# Patient Record
Sex: Female | Born: 1945 | Race: Black or African American | Hispanic: No | Marital: Single | Smoking: Never smoker
Health system: Southern US, Community
[De-identification: ages and names within clinical notes are randomized; demographics above are authoritative.]

## PROBLEM LIST (undated history)

## (undated) DIAGNOSIS — F419 Anxiety disorder, unspecified: Secondary | ICD-10-CM

## (undated) DIAGNOSIS — I10 Essential (primary) hypertension: Secondary | ICD-10-CM

## (undated) DIAGNOSIS — M8580 Other specified disorders of bone density and structure, unspecified site: Secondary | ICD-10-CM

## (undated) DIAGNOSIS — E785 Hyperlipidemia, unspecified: Secondary | ICD-10-CM

## (undated) DIAGNOSIS — K649 Unspecified hemorrhoids: Secondary | ICD-10-CM

## (undated) HISTORY — DX: Hyperlipidemia, unspecified: E78.5

## (undated) HISTORY — DX: Unspecified hemorrhoids: K64.9

## (undated) HISTORY — DX: Essential (primary) hypertension: I10

## (undated) HISTORY — DX: Other specified disorders of bone density and structure, unspecified site: M85.80

## (undated) HISTORY — DX: Anxiety disorder, unspecified: F41.9

## (undated) HISTORY — PX: NO PAST SURGERIES: SHX2092

---

## 1999-08-18 ENCOUNTER — Emergency Department (HOSPITAL_COMMUNITY): Admission: EM | Admit: 1999-08-18 | Discharge: 1999-08-18 | Payer: Self-pay | Admitting: Emergency Medicine

## 1999-10-20 ENCOUNTER — Encounter: Payer: Self-pay | Admitting: Family Medicine

## 1999-10-20 ENCOUNTER — Encounter: Admission: RE | Admit: 1999-10-20 | Discharge: 1999-10-20 | Payer: Self-pay | Admitting: Family Medicine

## 1999-10-27 ENCOUNTER — Inpatient Hospital Stay (HOSPITAL_COMMUNITY): Admission: RE | Admit: 1999-10-27 | Discharge: 1999-10-29 | Payer: Self-pay | Admitting: *Deleted

## 2000-10-20 ENCOUNTER — Encounter: Admission: RE | Admit: 2000-10-20 | Discharge: 2000-10-20 | Payer: Self-pay | Admitting: Family Medicine

## 2000-10-20 ENCOUNTER — Encounter: Payer: Self-pay | Admitting: Family Medicine

## 2001-11-03 ENCOUNTER — Encounter: Payer: Self-pay | Admitting: Family Medicine

## 2001-11-03 ENCOUNTER — Encounter: Admission: RE | Admit: 2001-11-03 | Discharge: 2001-11-03 | Payer: Self-pay | Admitting: Family Medicine

## 2002-11-27 ENCOUNTER — Encounter: Admission: RE | Admit: 2002-11-27 | Discharge: 2002-11-27 | Payer: Self-pay | Admitting: Family Medicine

## 2002-11-27 ENCOUNTER — Encounter: Payer: Self-pay | Admitting: Family Medicine

## 2003-06-08 ENCOUNTER — Encounter: Payer: Self-pay | Admitting: Family Medicine

## 2003-06-08 ENCOUNTER — Encounter: Admission: RE | Admit: 2003-06-08 | Discharge: 2003-06-08 | Payer: Self-pay | Admitting: Family Medicine

## 2003-06-23 ENCOUNTER — Encounter: Payer: Self-pay | Admitting: Family Medicine

## 2003-06-23 ENCOUNTER — Encounter: Admission: RE | Admit: 2003-06-23 | Discharge: 2003-06-23 | Payer: Self-pay | Admitting: Family Medicine

## 2004-12-05 ENCOUNTER — Encounter: Admission: RE | Admit: 2004-12-05 | Discharge: 2004-12-05 | Payer: Self-pay | Admitting: Family Medicine

## 2005-06-26 ENCOUNTER — Other Ambulatory Visit: Admission: RE | Admit: 2005-06-26 | Discharge: 2005-06-26 | Payer: Self-pay | Admitting: Family Medicine

## 2005-08-14 ENCOUNTER — Encounter: Admission: RE | Admit: 2005-08-14 | Discharge: 2005-08-14 | Payer: Self-pay | Admitting: Family Medicine

## 2005-10-09 ENCOUNTER — Ambulatory Visit (HOSPITAL_COMMUNITY): Admission: RE | Admit: 2005-10-09 | Discharge: 2005-10-09 | Payer: Self-pay | Admitting: *Deleted

## 2006-09-17 ENCOUNTER — Encounter: Admission: RE | Admit: 2006-09-17 | Discharge: 2006-09-17 | Payer: Self-pay | Admitting: Family Medicine

## 2006-10-05 ENCOUNTER — Encounter: Admission: RE | Admit: 2006-10-05 | Discharge: 2006-10-05 | Payer: Self-pay | Admitting: Family Medicine

## 2007-10-28 ENCOUNTER — Encounter: Admission: RE | Admit: 2007-10-28 | Discharge: 2007-10-28 | Payer: Self-pay | Admitting: Gastroenterology

## 2009-09-11 ENCOUNTER — Other Ambulatory Visit: Admission: RE | Admit: 2009-09-11 | Discharge: 2009-09-11 | Payer: Self-pay | Admitting: Family Medicine

## 2009-10-23 ENCOUNTER — Encounter: Admission: RE | Admit: 2009-10-23 | Discharge: 2009-10-23 | Payer: Self-pay | Admitting: Family Medicine

## 2010-09-24 ENCOUNTER — Other Ambulatory Visit: Admission: RE | Admit: 2010-09-24 | Discharge: 2010-09-24 | Payer: Self-pay | Admitting: Family Medicine

## 2010-12-23 ENCOUNTER — Encounter
Admission: RE | Admit: 2010-12-23 | Discharge: 2010-12-23 | Payer: Self-pay | Source: Home / Self Care | Attending: Family Medicine | Admitting: Family Medicine

## 2011-05-08 NOTE — Procedures (Signed)
. [facility]  Patient:    Monique Benjamin                     MRN: [id number] Proc. Date: 10/27/99 Adm. Date:  [id number] Attending:  Mingo Amber CC:         Gretta Arab Valentina Lucks, M.D.             Abigail Miyamoto, M.D.                           Procedure Report  PROCEDURE:  Video upper endoscopy and video colonoscopy.  INDICATIONS:  A 65 year old female who presented to the office late last week complaining of feeling faint and having vomited, however, she has been noticing  blood per rectum for a few weeks.  She feels decreased energy.  Hemoglobin in the office was 7.9, and her father was operated on for colon cancer in his 36s.  PREPARATION:  She is n.p.o. since midnight having taken Phospho-Soda prep and a  clear liquid diet.  The mucosa is clean throughout.  PREPROCEDURE SEDATION:  Prior to the upper endoscopy, she received 60 mg of Demerol and 6 mg of Versed.  In addition, her throat was anesthetized with cetacaine spray, and she was on 2 L of nasal cannula O2.  The preprocedure hemoglobin was 6.7.  DESCRIPTION OF PROCEDURE: VIDEO UPPER ENDOSCOPY:  The Olympus video upper endoscope was inserted via the mouth and advanced through the upper esophageal sphincter with ease. Intubation was then carried out into the descending duodenum.  On withdrawal, the mucosa was carefully evaluated and found to be entirely normal from the descending duodenum to the distal esophagus.  There were no signs of inflammation or bleeding or any other abnormality.  At the conclusion of this procedure, the patients position was reversed for video colonoscopy.  The patient also received an additional 40 mg of Demerol and 4 mg of Versed and remained on 2 L of nasal cannula O2.  VIDEO COLONOSCOPY:  The Olympus video colonoscope was inserted via the rectum.  Note was made of a very enlarged, bleeding external hemorrhoids.  The colonoscope was advanced  through a very tortuous colon.  Extra abdominal pressure and rotation onto her back were required in order to obtain the cecum.  However, cecal landmarks were identified and photographed, and on withdrawal, the mucosa was carefully evaluated and found to be entirely normal from cecum to rectum.  There were no inflammatory or neoplastic lesions, and all bleeding seemed to be coming from these very large, external hemorrhoids.  The patient tolerated both procedures well.  Pulse, blood pressure, and oximetry testing were stable throughout.  She was observed in recovery for 45 minutes and arrangements were made for a surgical consultation for consideration of admission for hemorrhoidectomy.  The patient ill be transfused, if she consents, as soon as possible. DD:  10/27/99 TD:  10/28/99 Job: [id number]

## 2012-11-16 ENCOUNTER — Other Ambulatory Visit: Payer: Self-pay | Admitting: Family Medicine

## 2012-11-16 DIAGNOSIS — Z1231 Encounter for screening mammogram for malignant neoplasm of breast: Secondary | ICD-10-CM

## 2012-12-12 ENCOUNTER — Other Ambulatory Visit: Payer: Self-pay | Admitting: Diagnostic Neuroimaging

## 2012-12-12 DIAGNOSIS — R279 Unspecified lack of coordination: Secondary | ICD-10-CM

## 2012-12-12 DIAGNOSIS — M542 Cervicalgia: Secondary | ICD-10-CM

## 2012-12-23 ENCOUNTER — Ambulatory Visit
Admission: RE | Admit: 2012-12-23 | Discharge: 2012-12-23 | Disposition: A | Discharge status: Home / Self Care | Payer: Medicare Other | Source: Ambulatory Visit | Attending: Diagnostic Neuroimaging | Admitting: Diagnostic Neuroimaging

## 2012-12-23 DIAGNOSIS — M542 Cervicalgia: Secondary | ICD-10-CM

## 2012-12-23 DIAGNOSIS — R279 Unspecified lack of coordination: Secondary | ICD-10-CM

## 2012-12-29 ENCOUNTER — Ambulatory Visit
Admission: RE | Admit: 2012-12-29 | Discharge: 2012-12-29 | Disposition: A | Discharge status: Home / Self Care | Payer: Medicare Other | Source: Ambulatory Visit | Attending: Family Medicine | Admitting: Family Medicine

## 2012-12-29 DIAGNOSIS — Z1231 Encounter for screening mammogram for malignant neoplasm of breast: Secondary | ICD-10-CM

## 2013-05-04 ENCOUNTER — Telehealth: Payer: Self-pay | Admitting: Diagnostic Neuroimaging

## 2013-05-08 NOTE — Telephone Encounter (Signed)
I called and LMVM for pt to return call.   

## 2013-05-16 ENCOUNTER — Ambulatory Visit: Payer: Self-pay | Admitting: Diagnostic Neuroimaging

## 2013-05-17 NOTE — Telephone Encounter (Signed)
I called pt. No answer. Left message with MRI results on phone:  Equivocal MRI brain (without contrast) demonstrating few non-specific foci of gliosis.  Abnormal MRI cervical spine (without contrast) demonstrating: 1. At C3-4: Disc bulging with moderate left foraminal stenosis. 2. At C5-6: Disc bulging with mild right foraminal stenosis.  PLAN: - Major problems ruled out. No definite explanation of her symptoms. Would observe and manage conservatively.  -VRP

## 2013-09-11 ENCOUNTER — Other Ambulatory Visit: Payer: Self-pay | Admitting: Otolaryngology

## 2013-09-11 DIAGNOSIS — R05 Cough: Secondary | ICD-10-CM

## 2013-09-11 DIAGNOSIS — K219 Gastro-esophageal reflux disease without esophagitis: Secondary | ICD-10-CM

## 2013-09-15 ENCOUNTER — Ambulatory Visit
Admission: RE | Admit: 2013-09-15 | Discharge: 2013-09-15 | Disposition: A | Discharge status: Home / Self Care | Payer: Medicare Other | Source: Ambulatory Visit | Attending: Otolaryngology | Admitting: Otolaryngology

## 2013-09-15 DIAGNOSIS — R05 Cough: Secondary | ICD-10-CM

## 2013-09-15 DIAGNOSIS — K219 Gastro-esophageal reflux disease without esophagitis: Secondary | ICD-10-CM

## 2014-01-23 ENCOUNTER — Other Ambulatory Visit: Payer: Self-pay

## 2014-01-23 DIAGNOSIS — Z1231 Encounter for screening mammogram for malignant neoplasm of breast: Secondary | ICD-10-CM

## 2014-02-08 ENCOUNTER — Ambulatory Visit
Admission: RE | Admit: 2014-02-08 | Discharge: 2014-02-08 | Disposition: A | Discharge status: Home / Self Care | Payer: Medicare Other | Source: Ambulatory Visit

## 2014-02-08 DIAGNOSIS — Z1231 Encounter for screening mammogram for malignant neoplasm of breast: Secondary | ICD-10-CM

## 2014-08-16 ENCOUNTER — Ambulatory Visit (INDEPENDENT_AMBULATORY_CARE_PROVIDER_SITE_OTHER): Payer: Medicare Other | Admitting: Diagnostic Neuroimaging

## 2014-08-16 ENCOUNTER — Encounter: Payer: Self-pay | Admitting: Diagnostic Neuroimaging

## 2014-08-16 VITALS — BP 135/69 | HR 63 | Ht 64.5 in | Wt 141.0 lb

## 2014-08-16 DIAGNOSIS — R279 Unspecified lack of coordination: Secondary | ICD-10-CM

## 2014-08-16 DIAGNOSIS — M542 Cervicalgia: Secondary | ICD-10-CM

## 2014-08-16 NOTE — Patient Instructions (Signed)
Try a new hobby/activity to improve coordination.   Start gradual exercise program (check with [employer], club, gym or local community center).

## 2014-08-16 NOTE — Progress Notes (Signed)
[facility]  PATIENT: Monique Benjamin DOB: [birth year]  REFERRING CLINICIAN: Valentina Lucks HISTORY FROM: patient  REASON FOR VISIT: follow up   HISTORICAL  CHIEF COMPLAINT:  Chief Complaint  Patient presents with  . Follow-up    Rv#[id number]     HISTORY OF PRESENT ILLNESS:   UPDATE 08/16/14: Since last visit, symptoms are stable. Still feels like she occ is clumsy with setting objects on the table (few times per month). Some intermittent right neck pain. No sig progression of symptoms.  PRIOR HPI (12/09/12): 68 year old right-handed female with hyper tension and hypercholesterolemia here for evaluation of clumsiness and incoordination of her hands for past 5-6 months. Patient reports gradual onset, progressive incoordination of her hands. She describes sensation of difficulty placing a cup of the table smoothly and securely. Sometimes she feels difficulty in letting go of objects. This happens with her left and right hand. She's not noticed any difficulty with her legs or feet. No stumbling, balance difficulty or falls. She's had no trouble with her vision or swallowing. She does note some word finding difficulty and hesitancy in speech.   REVIEW OF SYSTEMS: Full 14 system review of systems performed and notable only for neck pain daytime sleepiness fatigue.  ALLERGIES: No Known Allergies  HOME MEDICATIONS: Outpatient Prescriptions Prior to Visit  Medication Sig Dispense Refill  . Bimatoprost (LUMIGAN OP) Apply to eye as directed. 1 drop into affected eye once every evening      . Calcium Carbonate-Vitamin D (CALCIUM-VITAMIN D) 500-200 MG-UNIT per tablet Take 1 tablet by mouth 3 (three) times daily.      . cholecalciferol (VITAMIN D) 1000 UNITS tablet Take 1,000 Units by mouth daily.      Marland Kitchen omeprazole (PRILOSEC) 40 MG capsule Take 40 mg by mouth 2 (two) times daily.      . cyclobenzaprine (FLEXERIL) 10 MG tablet Take 10 mg by mouth as directed.      .  ezetimibe-simvastatin (VYTORIN) 10-20 MG per tablet Take 1 tablet by mouth daily.       No facility-administered medications prior to visit.    PAST MEDICAL HISTORY: Past Medical History  Diagnosis Date  . Hypertension   . Hyperlipidemia   . Hemorrhoids   . Osteopenia   . Anxiety     PAST SURGICAL HISTORY: Past Surgical History  Procedure Laterality Date  . No past surgeries      FAMILY HISTORY: Family History  Problem Relation Age of Onset  . Heart disease Mother   . Diabetes Mother   . Heart failure Father   . Hepatitis C Sister   . Breast cancer Sister     SOCIAL HISTORY:  History   Social History  . Marital Status: Single    Spouse Name: N/A    Number of Children: 0  . Years of Education: college   Occupational History  . Retired    Social History Main Topics  . Smoking status: Never Smoker   . Smokeless tobacco: Never Used  . Alcohol Use: No  . Drug Use: No  . Sexual Activity: Not on file   Other Topics Concern  . Not on file   Social History Narrative   Patient lives at home alone.   Caffeine Use: none     PHYSICAL EXAM  Filed Vitals:   08/16/14 1323  BP: 135/69  Pulse: 63  Height: 5' 4.5" (1.638 m)  Weight: 141 lb (63.957 kg)    Not recorded    Body mass  index is 23.84 kg/(m^2).  GENERAL EXAM: Patient is in no distress; well developed, nourished and groomed; neck is supple  CARDIOVASCULAR: Regular rate and rhythm, no murmurs, no carotid bruits  NEUROLOGIC: MENTAL STATUS: awake, alert, language fluent, comprehension intact, naming intact, fund of knowledge appropriate CRANIAL NERVE: no papilledema on fundoscopic exam, pupils equal and reactive to light, visual fields full to confrontation, extraocular muscles intact, no nystagmus, facial sensation and strength symmetric, hearing intact, palate elevates symmetrically, uvula midline, shoulder shrug symmetric, tongue midline. MOTOR: normal bulk and tone, full strength in the BUE,  BLE SENSORY: normal and symmetric to light touch   COORDINATION: finger-nose-finger, fine finger movements normal REFLEXES: deep tendon reflexes present and symmetric GAIT/STATION: narrow based gait; able to walk tandem; romberg is negative    DIAGNOSTIC DATA (LABS, IMAGING, TESTING) - I reviewed patient records, labs, notes, testing and imaging myself where available.  12/23/12 MRI brain (without contrast) demonstrating few non-specific foci of gliosis.  12/23/12 MRI cervical spine (without contrast) demonstrating: 1. At C3-4: Disc bulging with moderate left foraminal stenosis. 2. At C5-6: Disc bulging with mild right foraminal stenosis.   ASSESSMENT AND PLAN  68 y.o. year old female here with old female with hypertension, hypercholesterolemia, with mild intermittent clumsiness of her hands. Also some word finding difficulties. No abnormalities noted on exam today. Neuroimaging unremarkable. No clear neurologic etiology for patient's symptoms.   PLAN: - monitor symptoms  Return if symptoms worsen or fail to improve.    Suanne Marker, MD 08/16/2014, 1:52 PM Certified in Neurology, Neurophysiology and Neuroimaging  [facility] [address] [place], [place] [address] [phone]

## 2015-01-28 ENCOUNTER — Other Ambulatory Visit: Payer: Self-pay

## 2015-01-28 DIAGNOSIS — Z1231 Encounter for screening mammogram for malignant neoplasm of breast: Secondary | ICD-10-CM

## 2015-02-14 ENCOUNTER — Ambulatory Visit
Admission: RE | Admit: 2015-02-14 | Discharge: 2015-02-14 | Disposition: A | Discharge status: Home / Self Care | Payer: Medicare Other | Source: Ambulatory Visit

## 2015-02-14 DIAGNOSIS — Z1231 Encounter for screening mammogram for malignant neoplasm of breast: Secondary | ICD-10-CM

## 2015-08-08 ENCOUNTER — Ambulatory Visit: Payer: Medicare Other | Admitting: Podiatry

## 2015-08-13 ENCOUNTER — Ambulatory Visit: Payer: Medicare Other | Admitting: Podiatry

## 2015-08-29 ENCOUNTER — Ambulatory Visit: Payer: Medicare Other | Admitting: Podiatry

## 2015-09-10 ENCOUNTER — Ambulatory Visit: Payer: Medicare Other | Admitting: Podiatry

## 2016-01-02 ENCOUNTER — Ambulatory Visit: Payer: Medicare Other | Admitting: Podiatry

## 2016-01-09 ENCOUNTER — Ambulatory Visit (INDEPENDENT_AMBULATORY_CARE_PROVIDER_SITE_OTHER): Payer: Medicare Other

## 2016-01-09 ENCOUNTER — Ambulatory Visit (INDEPENDENT_AMBULATORY_CARE_PROVIDER_SITE_OTHER): Payer: Medicare Other | Admitting: Podiatry

## 2016-01-09 ENCOUNTER — Telehealth: Payer: Self-pay | Admitting: *Deleted

## 2016-01-09 ENCOUNTER — Encounter: Payer: Self-pay | Admitting: Podiatry

## 2016-01-09 VITALS — BP 139/72 | HR 76 | Resp 12

## 2016-01-09 DIAGNOSIS — R200 Anesthesia of skin: Secondary | ICD-10-CM

## 2016-01-09 DIAGNOSIS — M79671 Pain in right foot: Secondary | ICD-10-CM

## 2016-01-09 DIAGNOSIS — R52 Pain, unspecified: Secondary | ICD-10-CM | POA: Diagnosis not present

## 2016-01-09 DIAGNOSIS — G629 Polyneuropathy, unspecified: Secondary | ICD-10-CM | POA: Diagnosis not present

## 2016-01-09 NOTE — Telephone Encounter (Addendum)
Referral to be faxed to [facility] with required form and pt clinicals and demographics.  01/13/2016 - Faxed referral and clinicals to [facility].

## 2016-01-09 NOTE — Progress Notes (Signed)
   Subjective:    Patient ID: Monique Benjamin, female    DOB: [birth year], 70 y.o.   MRN: [id number]  HPI : she presents today with a chief complaint of tingling and numbness to her right foot this seems to move up her right leg. She states this seems to be worsening as time goes on and has even noticed similar findings in her right hand and arm. She states that is been going on for at least 9 months and is only getting worse. She denies history of diabetes vascular disease pernicious anemia an trauma. She does relate some issues in her spine.    Review of Systems  Musculoskeletal: Positive for myalgias.       Objective:   Physical Exam : vital signs are stable she is alert and oriented 3. No apparent distress. Pulses are strongly palpable bilateral. Neurologic sensorium is intact per Semmes-Weinstein monofilament. No tarsal tunnel. Deep tendon reflexes are intact bilaterally muscle strength is intact bilaterally. Orthopedic evaluation demonstrates mild flexible digital deformities otherwise no major osseous abnormalities. No open lesions or wounds.        Assessment & Plan:   no neurological findings distally. 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   Plan: we will refer her to neurosurgery for evaluation.

## 2016-01-16 ENCOUNTER — Telehealth: Payer: Self-pay | Admitting: *Deleted

## 2016-01-16 NOTE — Telephone Encounter (Signed)
Left message at pt's home number to collect the back imagings and chart notes to take to [facility].  Left message on [facility] that pt would be looking in to getting records.

## 2016-02-24 ENCOUNTER — Other Ambulatory Visit: Payer: Self-pay

## 2016-02-24 DIAGNOSIS — Z1231 Encounter for screening mammogram for malignant neoplasm of breast: Secondary | ICD-10-CM

## 2016-03-05 ENCOUNTER — Ambulatory Visit
Admission: RE | Admit: 2016-03-05 | Discharge: 2016-03-05 | Disposition: A | Discharge status: Home / Self Care | Payer: Medicare Other | Source: Ambulatory Visit

## 2016-03-05 DIAGNOSIS — Z1231 Encounter for screening mammogram for malignant neoplasm of breast: Secondary | ICD-10-CM

## 2016-03-06 ENCOUNTER — Other Ambulatory Visit: Payer: Self-pay | Admitting: Family Medicine

## 2016-03-06 DIAGNOSIS — R928 Other abnormal and inconclusive findings on diagnostic imaging of breast: Secondary | ICD-10-CM

## 2016-03-19 ENCOUNTER — Other Ambulatory Visit: Payer: Medicare Other

## 2016-03-26 ENCOUNTER — Ambulatory Visit
Admission: RE | Admit: 2016-03-26 | Discharge: 2016-03-26 | Disposition: A | Discharge status: Home / Self Care | Payer: Medicare Other | Source: Ambulatory Visit | Attending: Family Medicine | Admitting: Family Medicine

## 2016-03-26 DIAGNOSIS — R928 Other abnormal and inconclusive findings on diagnostic imaging of breast: Secondary | ICD-10-CM

## 2016-06-04 ENCOUNTER — Ambulatory Visit: Payer: Medicare Other | Admitting: Podiatry

## 2016-06-11 ENCOUNTER — Telehealth: Payer: Self-pay | Admitting: *Deleted

## 2016-06-11 ENCOUNTER — Ambulatory Visit (INDEPENDENT_AMBULATORY_CARE_PROVIDER_SITE_OTHER): Payer: Medicare Other | Admitting: Podiatry

## 2016-06-11 ENCOUNTER — Encounter: Payer: Self-pay | Admitting: Podiatry

## 2016-06-11 VITALS — BP 167/88 | HR 69 | Resp 12

## 2016-06-11 DIAGNOSIS — Q828 Other specified congenital malformations of skin: Secondary | ICD-10-CM | POA: Diagnosis not present

## 2016-06-11 DIAGNOSIS — L03031 Cellulitis of right toe: Secondary | ICD-10-CM | POA: Diagnosis not present

## 2016-06-11 DIAGNOSIS — G629 Polyneuropathy, unspecified: Secondary | ICD-10-CM

## 2016-06-11 DIAGNOSIS — L02611 Cutaneous abscess of right foot: Secondary | ICD-10-CM | POA: Diagnosis not present

## 2016-06-11 MED ORDER — CEPHALEXIN 500 MG PO CAPS: 500.0000 mg | ORAL_CAPSULE | Freq: Three times a day (TID) | ORAL | Status: DC

## 2016-06-11 MED ORDER — NEOMYCIN-POLYMYXIN-HC 3.5-10000-1 OT SOLN: OTIC | Status: DC

## 2016-06-11 NOTE — Progress Notes (Signed)
She presents today with chief complaint of a painful hallux right foot. She states that 2 weeks ago her great toe started hurting beneath the nail and started draining pus. She states it has become worse over the past week or so and she is very concerned. He denies changes in her past medical history medications allergy surgeries and social history.  Objective: Vital signs are stable alert and oriented 3. Pulses are palpable. Right foot demonstrates purulence coming from beneath the hallux nail that appears to only be approximately 50% attached. There is a granuloma extending from beneath the distal nail free edge.  Assessment: Subungual abscess right hallux.  Plan: Total nail avulsion today with resection of all necrotic tissue and pyogenic granulomatous type tissue. She was given both oral and home-going instructions as well as a topical and oral antibiotic. Follow up with her in 1-2 weeks.

## 2016-06-11 NOTE — Telephone Encounter (Signed)
Pt states had a toenail procedure this morning and removed the dressing with her shoe, and can't get it back on.  I told pt to cover area with lightly coated antibacterial ointment bandaid and begin soaks tonight, that she may have a little more bleeding due to the area not having been able to make a clot yet, but to rest and elevate as much as possible and call with concerns. Pt states understanding.

## 2016-06-18 ENCOUNTER — Ambulatory Visit: Payer: Medicare Other | Admitting: Podiatry

## 2016-06-25 ENCOUNTER — Encounter: Payer: Self-pay | Admitting: Podiatry

## 2016-06-25 ENCOUNTER — Ambulatory Visit (INDEPENDENT_AMBULATORY_CARE_PROVIDER_SITE_OTHER): Payer: Medicare Other | Admitting: Podiatry

## 2016-06-25 DIAGNOSIS — L02611 Cutaneous abscess of right foot: Secondary | ICD-10-CM

## 2016-06-25 DIAGNOSIS — L03031 Cellulitis of right toe: Secondary | ICD-10-CM

## 2016-06-25 NOTE — Progress Notes (Signed)
She presents today 1 week status post nail avulsion hallux right. She denies fever chills nausea vomiting states is still little bit tender.  Objective: Vital signs are stable she is alert and oriented 3. Pulses are palpable. Granulation tissue is intact and epithelialization is occurring. I see no signs of infection.  Assessment: Well-healing surgical time great toe right foot without complications.  Plan: Discontinue Silvadene water soaks I did recommend she start Epsom salts and water soaks I recommended that she continue dissecting to completely heal. Covered during the daytime and leave open at bedtime.

## 2016-12-24 ENCOUNTER — Ambulatory Visit: Payer: Medicare Other | Admitting: Podiatry

## 2017-01-14 ENCOUNTER — Ambulatory Visit (INDEPENDENT_AMBULATORY_CARE_PROVIDER_SITE_OTHER): Payer: Medicare Other | Admitting: Podiatry

## 2017-01-14 ENCOUNTER — Encounter: Payer: Self-pay | Admitting: Podiatry

## 2017-01-14 DIAGNOSIS — M79676 Pain in unspecified toe(s): Secondary | ICD-10-CM

## 2017-01-14 DIAGNOSIS — B351 Tinea unguium: Secondary | ICD-10-CM | POA: Diagnosis not present

## 2017-01-14 DIAGNOSIS — Q828 Other specified congenital malformations of skin: Secondary | ICD-10-CM | POA: Diagnosis not present

## 2017-01-14 NOTE — Progress Notes (Signed)
She presents today with a chief complaint of painful elongated toenails and calluses.  Objective: Vital signs are stable she is alert and oriented 3. Pulses are intact. Toenails are long thick yellow dystrophic with mycotic porokeratosis plantar aspect of that are present.  Assessment: Patient limbs again onychomycosis and porokeratosis.  Plan: Debrided all reactive temperature tissue and debrided toenails 1 through 5 bilateral.

## 2017-04-22 ENCOUNTER — Ambulatory Visit: Payer: Medicare Other | Admitting: Podiatry

## 2017-04-29 ENCOUNTER — Ambulatory Visit: Payer: Medicare Other | Admitting: Podiatry

## 2017-05-21 ENCOUNTER — Other Ambulatory Visit: Payer: Self-pay | Admitting: Family Medicine

## 2017-05-21 DIAGNOSIS — Z1231 Encounter for screening mammogram for malignant neoplasm of breast: Secondary | ICD-10-CM

## 2017-05-25 ENCOUNTER — Ambulatory Visit: Payer: Medicare Other | Admitting: Podiatry

## 2017-06-07 ENCOUNTER — Ambulatory Visit
Admission: RE | Admit: 2017-06-07 | Discharge: 2017-06-07 | Disposition: A | Discharge status: Home / Self Care | Payer: Medicare Other | Source: Ambulatory Visit | Attending: Family Medicine | Admitting: Family Medicine

## 2017-06-07 DIAGNOSIS — Z1231 Encounter for screening mammogram for malignant neoplasm of breast: Secondary | ICD-10-CM

## 2017-06-08 ENCOUNTER — Encounter: Payer: Self-pay | Admitting: Podiatry

## 2017-06-08 ENCOUNTER — Ambulatory Visit (INDEPENDENT_AMBULATORY_CARE_PROVIDER_SITE_OTHER): Payer: Medicare Other | Admitting: Podiatry

## 2017-06-08 DIAGNOSIS — B351 Tinea unguium: Secondary | ICD-10-CM

## 2017-06-08 DIAGNOSIS — M79676 Pain in unspecified toe(s): Secondary | ICD-10-CM | POA: Diagnosis not present

## 2017-06-08 DIAGNOSIS — Q828 Other specified congenital malformations of skin: Secondary | ICD-10-CM | POA: Diagnosis not present

## 2017-06-09 NOTE — Progress Notes (Signed)
She presents today chief complaint of painful elongated toenails with corns and calluses.  Nails are thick yellow dystrophic onychomycotic multiple porokeratotic lesions plantarly. Pulses remain palpable.  Assessment: Pain limb secondary to onychomycosis and hyperkeratosis.  Plan: Debrided all reactive hyperkeratotic tissue and reviewed painful elongated toenails.

## 2017-09-09 ENCOUNTER — Ambulatory Visit (INDEPENDENT_AMBULATORY_CARE_PROVIDER_SITE_OTHER): Payer: Medicare Other | Admitting: Podiatry

## 2017-09-09 ENCOUNTER — Ambulatory Visit (INDEPENDENT_AMBULATORY_CARE_PROVIDER_SITE_OTHER): Payer: Medicare Other

## 2017-09-09 ENCOUNTER — Encounter: Payer: Self-pay | Admitting: Podiatry

## 2017-09-09 DIAGNOSIS — M79676 Pain in unspecified toe(s): Secondary | ICD-10-CM | POA: Diagnosis not present

## 2017-09-09 DIAGNOSIS — M7750 Other enthesopathy of unspecified foot: Secondary | ICD-10-CM

## 2017-09-09 DIAGNOSIS — B351 Tinea unguium: Secondary | ICD-10-CM

## 2017-09-09 DIAGNOSIS — Q828 Other specified congenital malformations of skin: Secondary | ICD-10-CM

## 2017-09-09 NOTE — Progress Notes (Signed)
She presents today with chief complaint of pain to both ankles as well as painful elongated toenails with corns and calluses.  Objective: Vital signs are stable alert and oriented 3. Pulses are palpable. Moderate edema to bilateral lower extremity resulting and reactive postinflammatory hyperpigmentation and pitting edema. Pain is not reproducible. Radiographs taken today in the office demonstrate only mild swelling of the soft tissues otherwise no obvious osseous abnormalities. Toenails are long patellar dystrophic with mycotic multiple porokeratotic or soft tissue lesions benign in nature to the plantar aspect of the bilateral foot.  Assessment: Porokeratosis plantarflexed elongated second metatarsal. Pain in limb secondary to onychomycosis bilateral. Porokeratotic lesion soft tissue benign lesions bilateral.  Plan: Debridement of toenails 1 through 5 bilateral. Debridement of all reactive hyperkeratosis.

## 2017-10-27 ENCOUNTER — Other Ambulatory Visit (HOSPITAL_COMMUNITY): Payer: Self-pay | Admitting: Family Medicine

## 2017-10-27 DIAGNOSIS — M79606 Pain in leg, unspecified: Secondary | ICD-10-CM

## 2017-10-28 ENCOUNTER — Inpatient Hospital Stay (HOSPITAL_COMMUNITY): Admission: RE | Admit: 2017-10-28 | Payer: Medicare Other | Source: Ambulatory Visit

## 2017-11-09 ENCOUNTER — Ambulatory Visit (HOSPITAL_COMMUNITY)
Admission: RE | Admit: 2017-11-09 | Discharge: 2017-11-09 | Disposition: A | Discharge status: Home / Self Care | Payer: Medicare Other | Source: Ambulatory Visit | Attending: Vascular Surgery | Admitting: Vascular Surgery

## 2017-11-09 DIAGNOSIS — M79606 Pain in leg, unspecified: Secondary | ICD-10-CM | POA: Diagnosis not present

## 2017-12-30 ENCOUNTER — Encounter: Payer: Self-pay | Admitting: Podiatry

## 2017-12-30 ENCOUNTER — Ambulatory Visit (INDEPENDENT_AMBULATORY_CARE_PROVIDER_SITE_OTHER): Payer: Medicare Other | Admitting: Podiatry

## 2017-12-30 DIAGNOSIS — B351 Tinea unguium: Secondary | ICD-10-CM | POA: Diagnosis not present

## 2017-12-30 DIAGNOSIS — M79676 Pain in unspecified toe(s): Secondary | ICD-10-CM | POA: Diagnosis not present

## 2017-12-30 DIAGNOSIS — Q828 Other specified congenital malformations of skin: Secondary | ICD-10-CM

## 2017-12-30 NOTE — Progress Notes (Signed)
She presents today with a chief complaint of painful elongated toenails and multiple calluses bilaterally.  Objective: Vital signs are stable alert oriented x3 pulses remain palpable.  Toenails are long thick yellow dystrophic clinically mycotic multiple deep porokeratotic lesions to the plantar aspect of the forefoot bilateral.  Assessment: Pain in limb secondary onychomycosis and porokeratosis bilateral.  Plan: Debridement of all reactive hyperkeratotic tissue debridement of toenails 1 through 5 bilateral.  Follow-up with her in 2 months.

## 2018-03-31 ENCOUNTER — Encounter: Payer: Self-pay | Admitting: Podiatry

## 2018-03-31 ENCOUNTER — Ambulatory Visit (INDEPENDENT_AMBULATORY_CARE_PROVIDER_SITE_OTHER): Payer: Medicare Other | Admitting: Podiatry

## 2018-03-31 DIAGNOSIS — M79676 Pain in unspecified toe(s): Secondary | ICD-10-CM

## 2018-03-31 DIAGNOSIS — B351 Tinea unguium: Secondary | ICD-10-CM | POA: Diagnosis not present

## 2018-03-31 DIAGNOSIS — Q828 Other specified congenital malformations of skin: Secondary | ICD-10-CM

## 2018-03-31 NOTE — Progress Notes (Signed)
She presents today chief complaint of painful elongated toenails and multiple calluses.  She states that she hardly can stand on his right foot.  Objective: Vital signs are stable she is alert and oriented x3.  Pulses are palpable.  She has reactive hyperkeratosis lateral aspect of the hallux interphalangeal joint bilaterally as well as sub-first metatarsal phalangeal joint sub-fourth metatarsal phalangeal joint primarily on the right foot.  Toenails are long thick yellow dystrophic with mycotic painful palpation as well as debridement.  Assessment: Pain in limb secondary to onychomycosis.  Plan: Debridement of toenails and reactive hyperkeratosis bilateral.  Follow-up with her in 3 months.

## 2018-06-03 ENCOUNTER — Other Ambulatory Visit: Payer: Self-pay | Admitting: Family Medicine

## 2018-06-03 DIAGNOSIS — Z1231 Encounter for screening mammogram for malignant neoplasm of breast: Secondary | ICD-10-CM

## 2018-06-30 ENCOUNTER — Ambulatory Visit
Admission: RE | Admit: 2018-06-30 | Discharge: 2018-06-30 | Disposition: A | Discharge status: Home / Self Care | Payer: Medicare Other | Source: Ambulatory Visit | Attending: Family Medicine | Admitting: Family Medicine

## 2018-06-30 DIAGNOSIS — Z1231 Encounter for screening mammogram for malignant neoplasm of breast: Secondary | ICD-10-CM

## 2018-07-07 ENCOUNTER — Ambulatory Visit (INDEPENDENT_AMBULATORY_CARE_PROVIDER_SITE_OTHER): Payer: Medicare Other | Admitting: Podiatry

## 2018-07-07 ENCOUNTER — Encounter: Payer: Self-pay | Admitting: Podiatry

## 2018-07-07 ENCOUNTER — Ambulatory Visit (INDEPENDENT_AMBULATORY_CARE_PROVIDER_SITE_OTHER): Payer: Medicare Other

## 2018-07-07 DIAGNOSIS — Q828 Other specified congenital malformations of skin: Secondary | ICD-10-CM

## 2018-07-07 DIAGNOSIS — M779 Enthesopathy, unspecified: Secondary | ICD-10-CM | POA: Diagnosis not present

## 2018-07-07 DIAGNOSIS — B351 Tinea unguium: Secondary | ICD-10-CM

## 2018-07-07 DIAGNOSIS — M778 Other enthesopathies, not elsewhere classified: Secondary | ICD-10-CM

## 2018-07-07 DIAGNOSIS — M79676 Pain in unspecified toe(s): Secondary | ICD-10-CM

## 2018-07-07 NOTE — Progress Notes (Signed)
She presents today stating that there must be something more than just the callus beneath the fourth knuckle she states that is killing me and has not gotten any better since we trimmed it last time.  Is also like to have her toenails trimmed.  She is wondering if she needs an x-ray to help evaluate for a possible fracture or something like that.  She denies any trauma she denies any swelling.  Objective: Vital signs are stable she is alert and oriented x3 pulses are palpable to the right lower extremity she has pain on palpation and range of motion of the fourth metatarsal phalangeal joint and pain on palpation of the reactive hyperkeratotic lesion beneath the fourth metatarsal.  She also has painful elongated toenails sharply incurvated nail margins appear to be clinically mycotic.  Radiographs taken today demonstrate no osseous abnormalities in this area.  Assessment: Capsulitis of the fourth metatarsal phalangeal joint plantar reactive hyperkeratotic lesion pain in limb secondary onychomycosis.  Plan: Debridement of all reactive hyperkeratotic tissue also chemical debridement with salicylic acid to be left on for 3 days without getting wet then she will wash the area thoroughly.  Also through the dorsal aspect of the foot I injected 10 mg of Kenalog to 1/2 mg Marcaine.  She tolerated procedure well without complications debrided toenails 1 through 5 bilateral cover service.

## 2018-10-06 ENCOUNTER — Ambulatory Visit (INDEPENDENT_AMBULATORY_CARE_PROVIDER_SITE_OTHER): Payer: Medicare Other | Admitting: Podiatry

## 2018-10-06 ENCOUNTER — Encounter: Payer: Self-pay | Admitting: Podiatry

## 2018-10-06 DIAGNOSIS — B351 Tinea unguium: Secondary | ICD-10-CM | POA: Diagnosis not present

## 2018-10-06 DIAGNOSIS — Q828 Other specified congenital malformations of skin: Secondary | ICD-10-CM

## 2018-10-06 DIAGNOSIS — M79676 Pain in unspecified toe(s): Secondary | ICD-10-CM

## 2018-10-06 NOTE — Progress Notes (Signed)
She presents today chief complaint of painful elongated toenails and a callus sub-third metatarsal head of the right foot.  She states that it has not been as painful this time since replaced the padding last time.  Objective: Vital signs are stable alert oriented x3.  Toenails are long thick yellow dystrophic onychomycotic pulses remain palpable no open lesions or wounds.  Reactive hyperkeratotic lesion sub-third metatarsal head of the right foot does not demonstrate any type of ulceration.  Assessment: Pain in limb secondary to plantarflexed metatarsal with reactive hyper keratoma and pain elongated toenails 1 through 5 bilateral.  Plan: Debridement of toenails and debridement of reactive hyperkeratotic tissue placed padding no iatrogenic lesions noted follow-up with me in 3 months

## 2018-12-08 DIAGNOSIS — H401132 Primary open-angle glaucoma, bilateral, moderate stage: Secondary | ICD-10-CM | POA: Insufficient documentation

## 2018-12-08 DIAGNOSIS — H43393 Other vitreous opacities, bilateral: Secondary | ICD-10-CM | POA: Insufficient documentation

## 2018-12-08 DIAGNOSIS — Z83511 Family history of glaucoma: Secondary | ICD-10-CM | POA: Insufficient documentation

## 2018-12-20 ENCOUNTER — Ambulatory Visit: Payer: Medicare Other | Admitting: Podiatry

## 2019-01-05 ENCOUNTER — Ambulatory Visit (INDEPENDENT_AMBULATORY_CARE_PROVIDER_SITE_OTHER): Payer: Medicare Other | Admitting: Podiatry

## 2019-01-05 ENCOUNTER — Encounter: Payer: Self-pay | Admitting: Podiatry

## 2019-01-05 DIAGNOSIS — M79676 Pain in unspecified toe(s): Secondary | ICD-10-CM | POA: Diagnosis not present

## 2019-01-05 DIAGNOSIS — Q828 Other specified congenital malformations of skin: Secondary | ICD-10-CM | POA: Diagnosis not present

## 2019-01-05 DIAGNOSIS — B351 Tinea unguium: Secondary | ICD-10-CM | POA: Diagnosis not present

## 2019-01-05 NOTE — Progress Notes (Signed)
She presents today chief complaint of painful toenails and calluses bilaterally.  Objective: Vital signs are stable she is alert and oriented x3 pulses are palpable but mildly reduced.  Capillary fill time is not increased.  Toenails are thick yellow dystrophic-like mycotic painful on palpation.  Reactive hyper keratoma sub-plantar aspect of the forefoot bilateral.  Right seems to be worse than the left.  Assessment: Pain in limb secondary to onychomycosis and porokeratosis.  Plan: Debridement of toenails and reactive hyperkeratoses bilaterally.  Follow-up with her as needed.

## 2019-01-15 IMAGING — MG DIGITAL SCREENING BILATERAL MAMMOGRAM WITH CAD
5 series · 5 of 5 positions shown · non-contrast
Comparison: Previous exam(s).

CLINICAL DATA: Screening.

EXAM:
DIGITAL SCREENING BILATERAL MAMMOGRAM WITH CAD

[L MLO (1 of 2)]
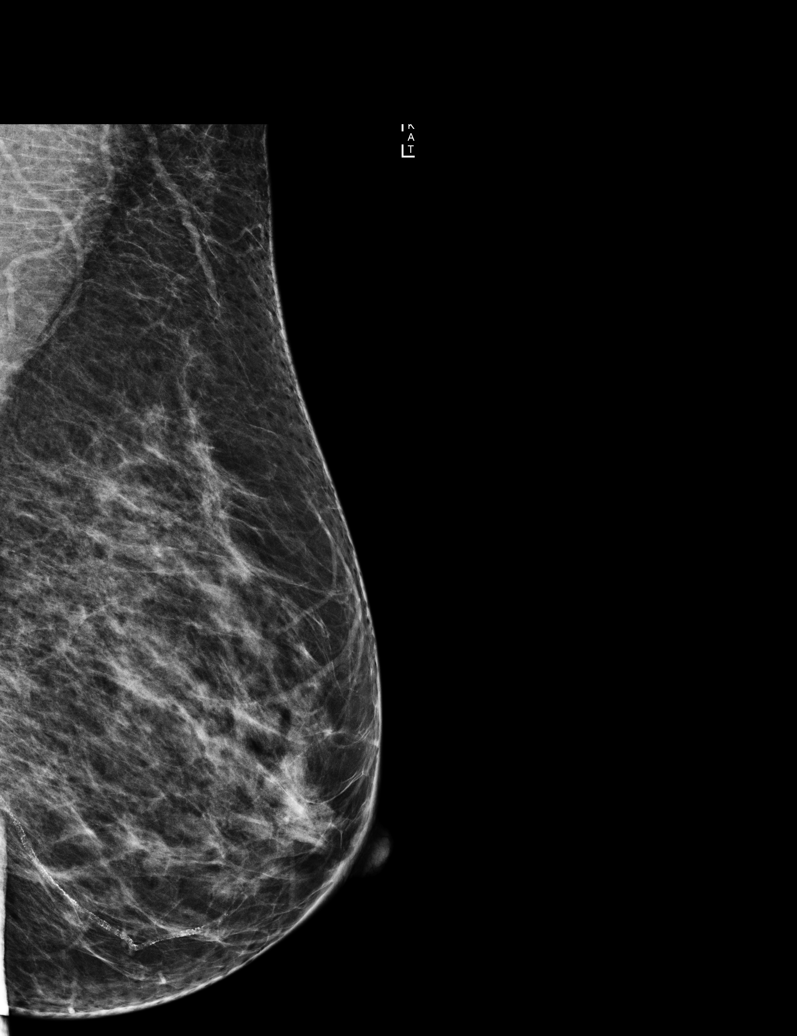

[L MLO (2 of 2)]
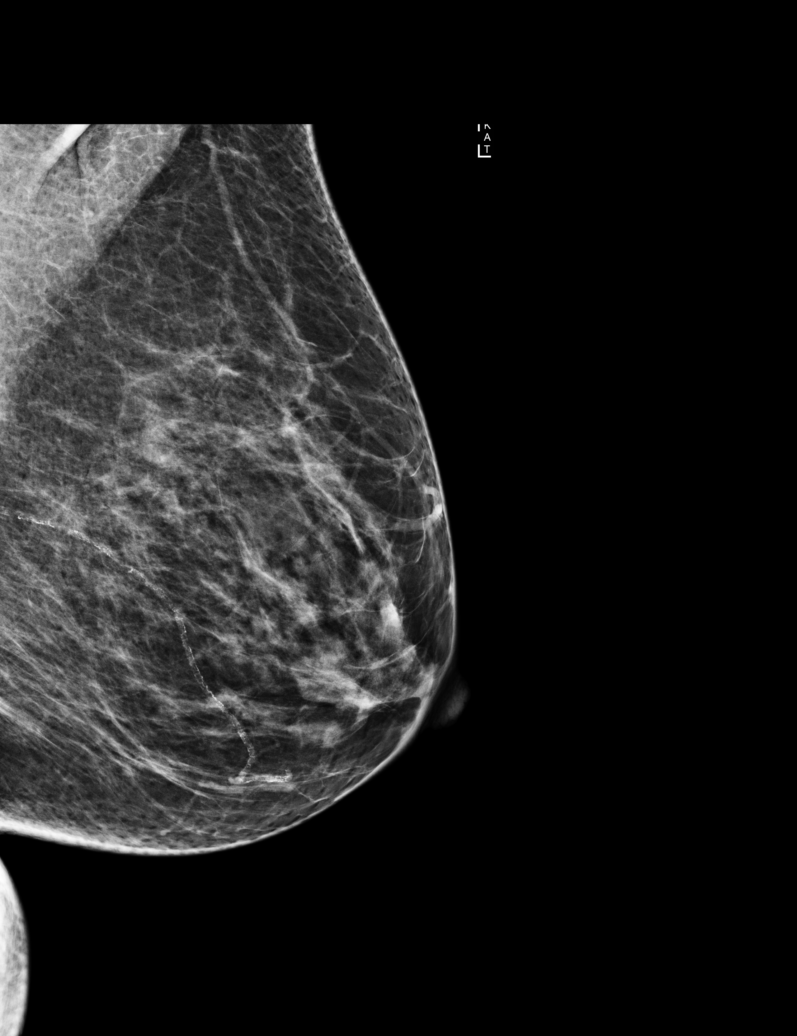

[R CC]
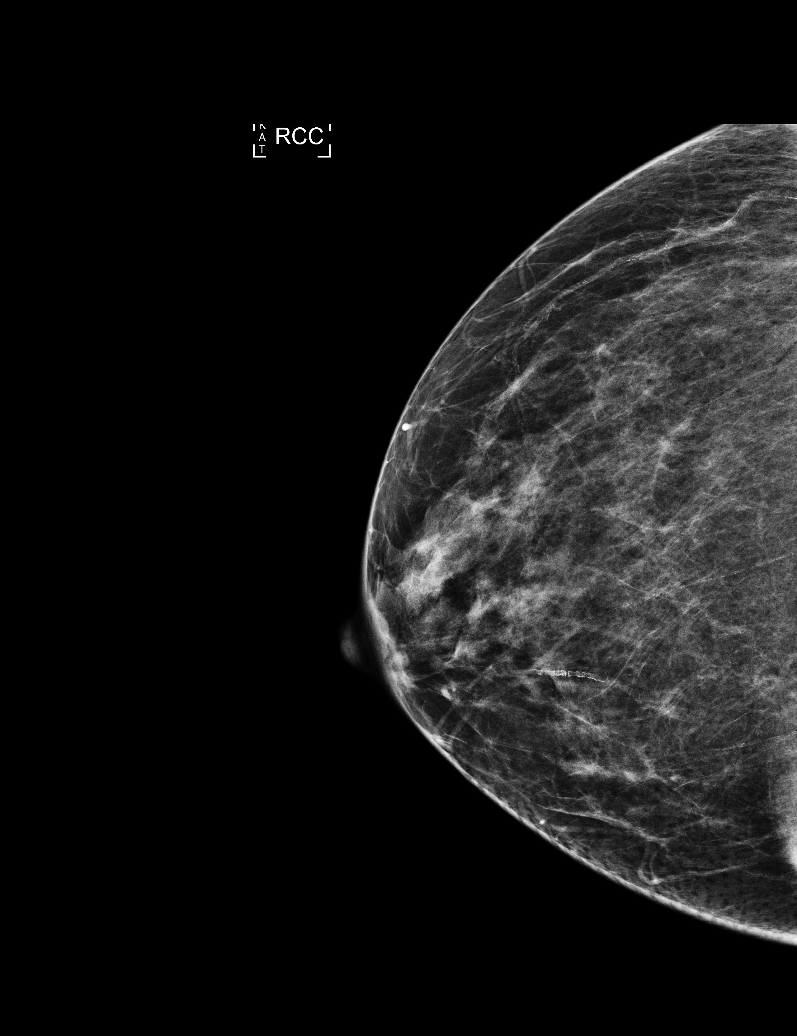

[R MLO]
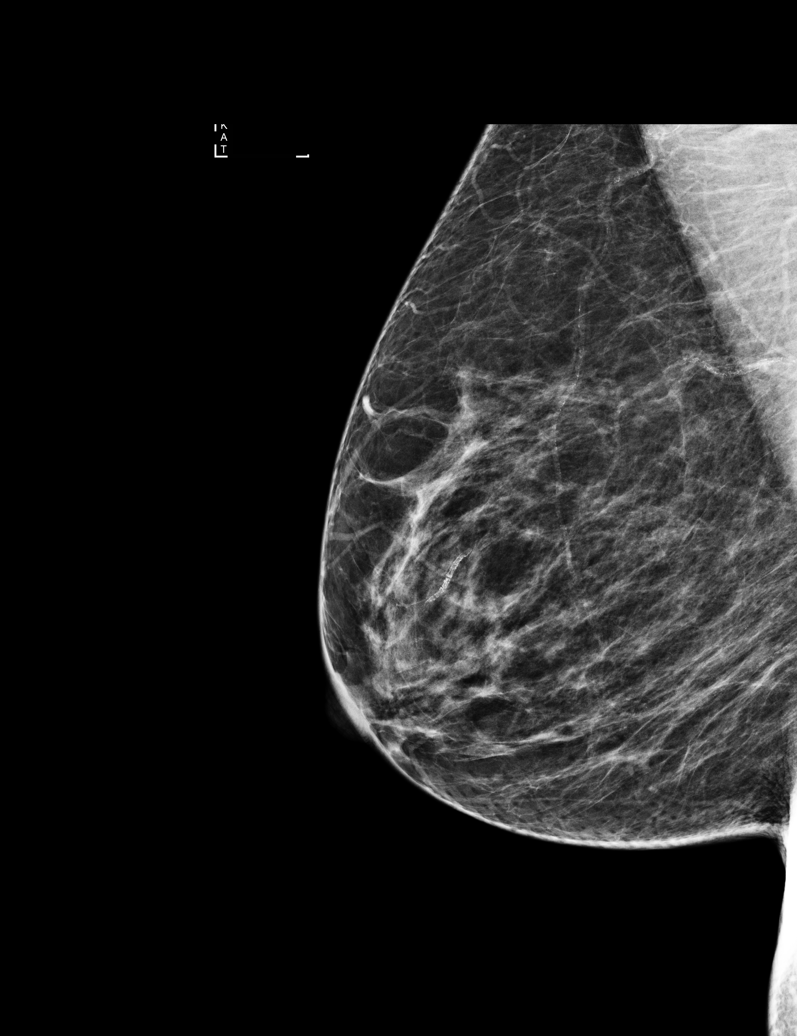

[L CC]
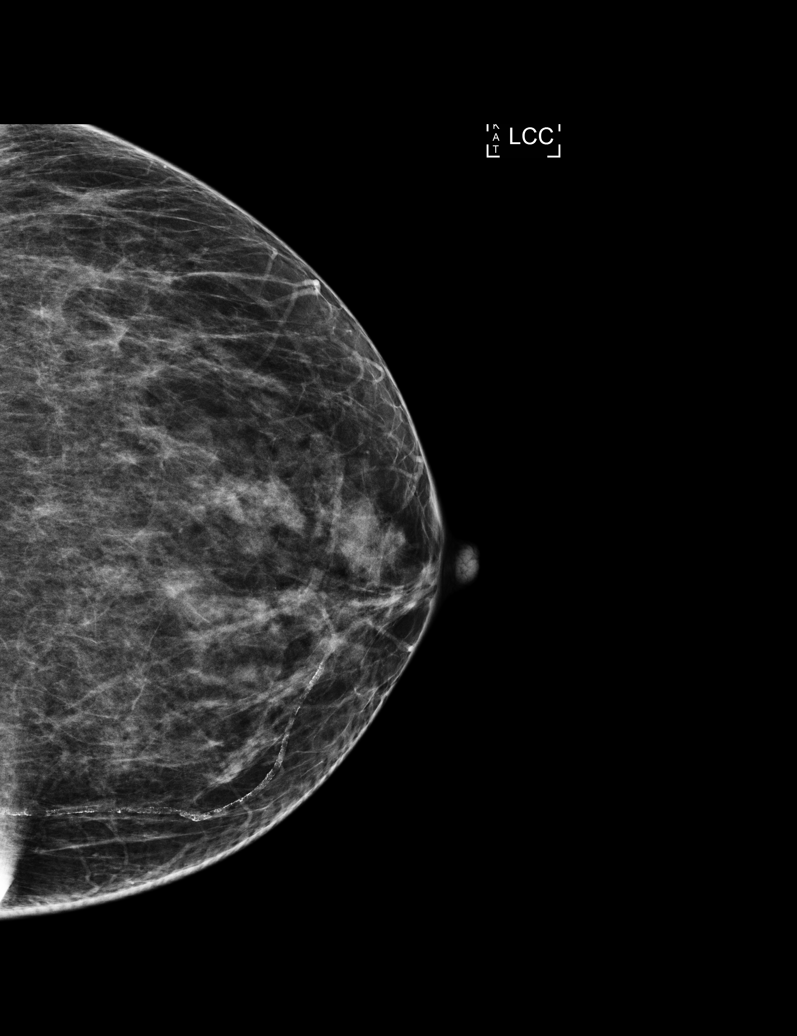

[5 of 5 positions shown; findings below may reference images not displayed]

ACR Breast Density Category b: There are scattered areas of
fibroglandular density.
FINDINGS: There are no findings suspicious for malignancy. Images were
processed with CAD.
IMPRESSION: No mammographic evidence of malignancy. A result letter of this
screening mammogram will be mailed directly to the patient.

RECOMMENDATION:
Screening mammogram in one year. (Code:AS-G-LCT)

BI-RADS CATEGORY  1: Negative.

## 2019-04-06 ENCOUNTER — Ambulatory Visit: Payer: Medicare Other | Admitting: Podiatry

## 2019-04-07 ENCOUNTER — Other Ambulatory Visit: Payer: Self-pay | Admitting: Family Medicine

## 2019-04-07 DIAGNOSIS — M8580 Other specified disorders of bone density and structure, unspecified site: Secondary | ICD-10-CM

## 2019-04-25 ENCOUNTER — Ambulatory Visit (INDEPENDENT_AMBULATORY_CARE_PROVIDER_SITE_OTHER): Payer: Medicare Other | Admitting: Podiatry

## 2019-04-25 ENCOUNTER — Other Ambulatory Visit: Payer: Self-pay

## 2019-04-25 ENCOUNTER — Encounter: Payer: Self-pay | Admitting: Podiatry

## 2019-04-25 VITALS — Temp 97.5°F

## 2019-04-25 DIAGNOSIS — B351 Tinea unguium: Secondary | ICD-10-CM

## 2019-04-25 DIAGNOSIS — Q828 Other specified congenital malformations of skin: Secondary | ICD-10-CM

## 2019-04-25 DIAGNOSIS — M79676 Pain in unspecified toe(s): Secondary | ICD-10-CM | POA: Diagnosis not present

## 2019-04-26 ENCOUNTER — Encounter: Payer: Self-pay | Admitting: Podiatry

## 2019-04-26 NOTE — Progress Notes (Signed)
She presents today chief complaint of painful elongated toenails and calluses plantar aspect of bilateral foot.  Objective: Vital signs are stable she is alert and oriented x3.  Pulses are palpable.  Reactive hyper keratomas sub-IP joint sub-first second and fifth right sub-IP joint hallux left.  Toenails are thick yellow dystrophic-like mycotic no open lesions or wounds.  No signs of infection.  Assessment: Pain in limb secondary to onychomycosis and porokeratotic lesions.  Plan: Debridement of all necrotic tissue debridement of toenails 1 through 5 bilateral.

## 2019-07-20 ENCOUNTER — Ambulatory Visit: Payer: Medicare Other | Admitting: Podiatry

## 2019-07-27 ENCOUNTER — Other Ambulatory Visit: Payer: Medicare Other

## 2019-08-11 ENCOUNTER — Ambulatory Visit (INDEPENDENT_AMBULATORY_CARE_PROVIDER_SITE_OTHER): Payer: Medicare Other | Admitting: Podiatry

## 2019-08-11 ENCOUNTER — Other Ambulatory Visit: Payer: Self-pay

## 2019-08-11 ENCOUNTER — Encounter: Payer: Self-pay | Admitting: Podiatry

## 2019-08-11 VITALS — Temp 97.9°F

## 2019-08-11 DIAGNOSIS — M79671 Pain in right foot: Secondary | ICD-10-CM

## 2019-08-11 DIAGNOSIS — B351 Tinea unguium: Secondary | ICD-10-CM | POA: Diagnosis not present

## 2019-08-11 DIAGNOSIS — M79676 Pain in unspecified toe(s): Secondary | ICD-10-CM | POA: Diagnosis not present

## 2019-08-11 DIAGNOSIS — Q828 Other specified congenital malformations of skin: Secondary | ICD-10-CM

## 2019-08-11 DIAGNOSIS — M79672 Pain in left foot: Secondary | ICD-10-CM | POA: Diagnosis not present

## 2019-08-11 NOTE — Patient Instructions (Signed)

## 2019-08-21 NOTE — Progress Notes (Signed)
Subjective: Monique Benjamin presents to clinic with cc of painful mycotic toenails and calluses both feet  which are aggravated when weightbearing with and without shoe gear.  This pain limits her daily activities. Pain symptoms resolve with periodic professional debridement.  Kelton Pillar, MD is her PCP.    Current Outpatient Medications:  .  amLODipine (NORVASC) 5 MG tablet, Take 5 mg by mouth daily., Disp: , Rfl: 8 .  Bimatoprost (LUMIGAN OP), Apply to eye as directed. 1 drop into affected eye once every evening, Disp: , Rfl:  .  Calcium Carbonate-Vitamin D (CALCIUM-VITAMIN D) 500-200 MG-UNIT per tablet, Take 1 tablet by mouth 3 (three) times daily., Disp: , Rfl:  .  cholecalciferol (VITAMIN D) 1000 UNITS tablet, Take 1,000 Units by mouth daily., Disp: , Rfl:  .  hydrochlorothiazide (HYDRODIURIL) 12.5 MG tablet, Take 12.5 mg by mouth every morning., Disp: , Rfl: 6 .  latanoprost (XALATAN) 0.005 % ophthalmic solution, INSTILL 1 DROP AT BEDTIME IN BOTH EYES, Disp: , Rfl: 3 .  omeprazole (PRILOSEC) 40 MG capsule, Take 40 mg by mouth 2 (two) times daily., Disp: , Rfl:  .  rosuvastatin (CRESTOR) 5 MG tablet, Take 5 mg by mouth daily., Disp: , Rfl: 7   No Known Allergies   Objective: Vitals:   08/11/19 1322  Temp: 97.9 F (36.6 C)    Physical Examination:  Vascular  Examination: Capillary refill time immediate x 10 digits.  Palpable DP/PT pulses b/l.  Digital hair present b/l.  No edema noted b/l.  Skin temperature gradient WNL b/l.  Dermatological Examination: Skin with normal turgor, texture and tone b/l.  No open wounds b/l.  No interdigital macerations noted b/l.  Elongated, thick, discolored brittle toenails with subungual debris and pain on dorsal palpation of nailbeds 1-5 b/l.  Porokeratotic lesions sub hallux IPJ b/l, submet heads 3, 5 right foot with tenderness to palpation. No erythema, no edema, no drainage, no flocculence.  Musculoskeletal  Examination: Muscle strength 5/5 to all muscle groups b/l.  No pain, crepitus or joint discomfort with active/passive ROM.  Neurological Examination: Sensation intact 5/5 b/l with 10 gram monofilament.  Vibratory sensation intact b/l.  Proprioceptive sensation intact b/l.  Assessment: 1. Mycotic nail infection with pain 1-5 b/l 2. Porokeratoses with pain sub hallux IPJ b/l, submet heads 3, 5 right foot  Plan: 1. Toenails 1-5 b/l were debrided in length and girth without iatrogenic laceration. 2. Porokeratosis sub hallux IPJ b/l, submet heads 3, 5 right foot pared and enucleated with sterile scalpel blade without incident. 3. Continue soft, supportive shoe gear daily. 4. Report any pedal injuries to medical professional. 5. Follow up 3 months. 6. Patient/POA to call should there be a question/concern in there interim.

## 2019-10-17 ENCOUNTER — Other Ambulatory Visit: Payer: Self-pay | Admitting: Family Medicine

## 2019-10-17 DIAGNOSIS — Z1231 Encounter for screening mammogram for malignant neoplasm of breast: Secondary | ICD-10-CM

## 2019-10-19 ENCOUNTER — Other Ambulatory Visit: Payer: Medicare Other

## 2019-12-08 ENCOUNTER — Ambulatory Visit
Admission: RE | Admit: 2019-12-08 | Discharge: 2019-12-08 | Disposition: A | Discharge status: Home / Self Care | Payer: Medicare Other | Source: Ambulatory Visit | Attending: Family Medicine | Admitting: Family Medicine

## 2019-12-08 ENCOUNTER — Ambulatory Visit (INDEPENDENT_AMBULATORY_CARE_PROVIDER_SITE_OTHER): Payer: Medicare Other | Admitting: Podiatry

## 2019-12-08 ENCOUNTER — Other Ambulatory Visit: Payer: Self-pay

## 2019-12-08 ENCOUNTER — Encounter: Payer: Self-pay | Admitting: Podiatry

## 2019-12-08 DIAGNOSIS — M79676 Pain in unspecified toe(s): Secondary | ICD-10-CM

## 2019-12-08 DIAGNOSIS — Q828 Other specified congenital malformations of skin: Secondary | ICD-10-CM

## 2019-12-08 DIAGNOSIS — M79671 Pain in right foot: Secondary | ICD-10-CM

## 2019-12-08 DIAGNOSIS — M79672 Pain in left foot: Secondary | ICD-10-CM

## 2019-12-08 DIAGNOSIS — B351 Tinea unguium: Secondary | ICD-10-CM

## 2019-12-08 DIAGNOSIS — Z1231 Encounter for screening mammogram for malignant neoplasm of breast: Secondary | ICD-10-CM

## 2019-12-08 NOTE — Patient Instructions (Signed)

## 2019-12-18 NOTE — Progress Notes (Signed)
Subjective: Monique Benjamin presents to clinic with cc of painful porokeratotic lesions and mycotic toenails which are aggravated when weightbearing with and without shoe gear.  This pain limits her daily activities. Pain symptoms resolve with periodic professional debridement.  Kelton Pillar, MD is her PCP.  Medications reviewed in chart.  No Known Allergies   Objective: There were no vitals filed for this visit.  Physical Examination:  Vascular  Examination: Capillary refill time immediate b/l.  DP/PT pulses are palpable b/l.  Digital hair present b/l.  No edema noted b/l.  Skin temperature gradient WNL b/l.  Dermatological Examination: Skin with normal turgor, texture and tone b/l.  Elongated, thick, discolored brittle toenails with subungual debris and pain on dorsal palpation of nailbeds 1-5 b/l.  Porokeratotic lesions submet head 3, 5 right foot and bilateral hallux with tenderness to palpation. No erythema, no edema, no drainage, no flocculence.   Musculoskeletal Examination: Muscle strength 5/5 to all muscle groups b/l.  No pain, crepitus or joint discomfort with active/passive ROM.  Neurological Examination: Sensation intact 5/5 b/l with 10 gram monofilament.  Assessment: 1. Mycotic nail infection with pain 1-5 b/l 2. Painful porokeratotic lesions bilateral hallux and submet head 3, 5 right foot  Plan: 1. Toenails 1-5 b/l were debrided in length and girth without iatrogenic laceration. 2. Porokeratosis bilateral hallux and submet head 3, 5 right foot pared and enucleated with sterile scalpel blade without incident. 3. Continue soft, supportive shoe gear daily. 4. Report any pedal injuries to medical professional. 5. Follow up 3 months. 6. Patient/POA to call should there be a question/concern in there interim.

## 2020-01-11 ENCOUNTER — Other Ambulatory Visit: Payer: Medicare Other

## 2020-03-19 ENCOUNTER — Ambulatory Visit (INDEPENDENT_AMBULATORY_CARE_PROVIDER_SITE_OTHER): Payer: Medicare Other | Admitting: Podiatry

## 2020-03-19 ENCOUNTER — Other Ambulatory Visit: Payer: Self-pay

## 2020-03-19 ENCOUNTER — Encounter: Payer: Self-pay | Admitting: Podiatry

## 2020-03-19 VITALS — Temp 97.8°F

## 2020-03-19 DIAGNOSIS — M79671 Pain in right foot: Secondary | ICD-10-CM

## 2020-03-19 DIAGNOSIS — B351 Tinea unguium: Secondary | ICD-10-CM

## 2020-03-19 DIAGNOSIS — M79672 Pain in left foot: Secondary | ICD-10-CM | POA: Diagnosis not present

## 2020-03-19 DIAGNOSIS — M79676 Pain in unspecified toe(s): Secondary | ICD-10-CM | POA: Diagnosis not present

## 2020-03-19 DIAGNOSIS — Q828 Other specified congenital malformations of skin: Secondary | ICD-10-CM

## 2020-03-19 NOTE — Patient Instructions (Signed)
Corns and Calluses Corns are small areas of thickened skin that occur on the top, sides, or tip of a toe. They contain a cone-shaped core with a point that can press on a nerve below. This causes pain.  Calluses are areas of thickened skin that can occur anywhere on the body, including the hands, fingers, palms, soles of the feet, and heels. Calluses are usually larger than corns. What are the causes? Corns and calluses are caused by rubbing (friction) or pressure, such as from shoes that are too tight or do not fit properly. What increases the risk? Corns are more likely to develop in people who have misshapen toes (toe deformities), such as hammer toes. Calluses can occur with friction to any area of the skin. They are more likely to develop in people who:  Work with their hands.  Wear shoes that fit poorly, are too tight, or are high-heeled.  Have toe deformities. What are the signs or symptoms? Symptoms of a corn or callus include:  A hard growth on the skin.  Pain or tenderness under the skin.  Redness and swelling.  Increased discomfort while wearing tight-fitting shoes, if your feet are affected. If a corn or callus becomes infected, symptoms may include:  Redness and swelling that gets worse.  Pain.  Fluid, blood, or pus draining from the corn or callus. How is this diagnosed? Corns and calluses may be diagnosed based on your symptoms, your medical history, and a physical exam. How is this treated? Treatment for corns and calluses may include:  Removing the cause of the friction or pressure. This may involve: ? Changing your shoes. ? Wearing shoe inserts (orthotics) or other protective layers in your shoes, such as a corn pad. ? Wearing gloves.  Applying medicine to the skin (topical medicine) to help soften skin in the hardened, thickened areas.  Removing layers of dead skin with a file to reduce the size of the corn or callus.  Removing the corn or callus with a  scalpel or laser.  Taking antibiotic medicines, if your corn or callus is infected.  Having surgery, if a toe deformity is the cause. Follow these instructions at home:   Take over-the-counter and prescription medicines only as told by your health care provider.  If you were prescribed an antibiotic, take it as told by your health care provider. Do not stop taking it even if your condition starts to improve.  Wear shoes that fit well. Avoid wearing high-heeled shoes and shoes that are too tight or too loose.  Wear any padding, protective layers, gloves, or orthotics as told by your health care provider.  Soak your hands or feet and then use a file or pumice stone to soften your corn or callus. Do this as told by your health care provider.  Check your corn or callus every day for symptoms of infection. Contact a health care provider if you:  Notice that your symptoms do not improve with treatment.  Have redness or swelling that gets worse.  Notice that your corn or callus becomes painful.  Have fluid, blood, or pus coming from your corn or callus.  Have new symptoms. Summary  Corns are small areas of thickened skin that occur on the top, sides, or tip of a toe.  Calluses are areas of thickened skin that can occur anywhere on the body, including the hands, fingers, palms, and soles of the feet. Calluses are usually larger than corns.  Corns and calluses are caused by   rubbing (friction) or pressure, such as from shoes that are too tight or do not fit properly.  Treatment may include wearing any padding, protective layers, gloves, or orthotics as told by your health care provider. This information is not intended to replace advice given to you by your health care provider. Make sure you discuss any questions you have with your health care provider. Document Revised: 03/29/2019 Document Reviewed: 10/20/2017 Elsevier Patient Education  2020 [employer]  Onychomycosis/Fungal  Toenails  WHAT IS IT? An infection that lies within the keratin of your nail plate that is caused by a fungus.  WHY ME? Fungal infections affect all ages, sexes, races, and creeds.  There may be many factors that predispose you to a fungal infection such as age, coexisting medical conditions such as diabetes, or an autoimmune disease; stress, medications, fatigue, genetics, etc.  Bottom line: fungus thrives in a warm, moist environment and your shoes offer such a location.  IS IT CONTAGIOUS? Theoretically, yes.  You do not want to share shoes, nail clippers or files with someone who has fungal toenails.  Walking around barefoot in the same room or sleeping in the same bed is unlikely to transfer the organism.  It is important to realize, however, that fungus can spread easily from one nail to the next on the same foot.  HOW DO WE TREAT THIS?  There are several ways to treat this condition.  Treatment may depend on many factors such as age, medications, pregnancy, liver and kidney conditions, etc.  It is best to ask your doctor which options are available to you.  4. No treatment.   Unlike many other medical concerns, you can live with this condition.  However for many people this can be a painful condition and may lead to ingrown toenails or a bacterial infection.  It is recommended that you keep the nails cut short to help reduce the amount of fungal nail. 5. Topical treatment.  These range from herbal remedies to prescription strength nail lacquers.  About 40-50% effective, topicals require twice daily application for approximately 9 to 12 months or until an entirely new nail has grown out.  The most effective topicals are medical grade medications available through physicians offices. 6. Oral antifungal medications.  With an 80-90% cure rate, the most common oral medication requires 3 to 4 months of therapy and stays in your system for a year as the new nail grows out.  Oral antifungal medications do  require blood work to make sure it is a safe drug for you.  A liver function panel will be performed prior to starting the medication and after the first month of treatment.  It is important to have the blood work performed to avoid any harmful side effects.  In general, this medication safe but blood work is required. 7. Laser Therapy.  This treatment is performed by applying a specialized laser to the affected nail plate.  This therapy is noninvasive, fast, and non-painful.  It is not covered by insurance and is therefore, out of pocket.  The results have been very good with a 80-95% cure rate.  The [facility] is the only practice in the area to offer this therapy. 8. Permanent Nail Avulsion.  Removing the entire nail so that a new nail will not grow back. 

## 2020-03-20 NOTE — Progress Notes (Signed)
Subjective: Monique Benjamin presents today for follow up of painful porokeratotic lesion(s) b/l and painful mycotic toenails b/l that limit ambulation. Aggravating factors include weightbearing with and without shoe gear. Pain for both is relieved with periodic professional debridement. She states her submet head 3 lesion is more painful today. Denies any redness, drainage. Feels the top of her foot is painful as well, but admits to walking on her heel and keeping her foot dorsiflexed to avoid placing weight on the porokeratotic lesion.  No Known Allergies   Objective: Vitals:   03/19/20 1330  Temp: 97.8 F (36.6 C)    Pt 74 y.o. year old AA female  in NAD. AAO x 3.   Vascular Examination:  Capillary refill time to digits immediate b/l. Palpable DP pulses b/l. Palpable PT pulses b/l. Pedal hair present b/l. Skin temperature gradient within normal limits b/l.  Dermatological Examination: Pedal skin with normal turgor, texture and tone bilaterally. No open wounds bilaterally. No interdigital macerations bilaterally. Toenails 1-5 b/l elongated, dystrophic, thickened, crumbly with subungual debris and tenderness to dorsal palpation. Porokeratotic lesion(s) L hallux, R hallux, submet head 3 right foot and submet head 5 right foot. No erythema, no edema, no drainage, no flocculence. No warmth to right foot. No suspected abscess.  Musculoskeletal: Normal muscle strength 5/5 to all lower extremity muscle groups bilaterally, no gross bony deformities bilaterally and no pain crepitus or joint limitation noted with ROM b/l  Neurological: Protective sensation intact 5/5 intact bilaterally with 10g monofilament b/l  Assessment: 1. Pain due to onychomycosis of toenail   2. Porokeratosis   3. Pain in both feet    Plan: -Toenails 1-5 b/l were debrided in length and girth with sterile nail nippers and dremel without iatrogenic bleeding.  -Painful porokeratotic lesion(s) L hallux, R hallux, submet  head 3 right foot and submet head 5 right foot pared and enucleated with sterile scalpel blade without incident. I feel she has fatigue of her extensors due to chronic dorsiflexion. -Patient to continue soft, supportive shoe gear daily. -Patient to report any pedal injuries to medical professional immediately. -Patient/POA to call should there be question/concern in the interim.  Return in about 3 months (around 06/19/2020) for nail and callus trim.

## 2020-04-03 ENCOUNTER — Other Ambulatory Visit: Payer: Self-pay | Admitting: Family Medicine

## 2020-04-03 DIAGNOSIS — M8580 Other specified disorders of bone density and structure, unspecified site: Secondary | ICD-10-CM

## 2020-04-04 ENCOUNTER — Other Ambulatory Visit: Payer: Medicare Other

## 2020-06-18 ENCOUNTER — Other Ambulatory Visit: Payer: Medicare Other

## 2020-06-19 ENCOUNTER — Other Ambulatory Visit: Payer: Self-pay

## 2020-06-19 ENCOUNTER — Encounter: Payer: Self-pay | Admitting: Podiatry

## 2020-06-19 ENCOUNTER — Ambulatory Visit (INDEPENDENT_AMBULATORY_CARE_PROVIDER_SITE_OTHER): Payer: Medicare Other | Admitting: Podiatry

## 2020-06-19 DIAGNOSIS — M79676 Pain in unspecified toe(s): Secondary | ICD-10-CM

## 2020-06-19 DIAGNOSIS — B351 Tinea unguium: Secondary | ICD-10-CM | POA: Diagnosis not present

## 2020-06-19 DIAGNOSIS — M79671 Pain in right foot: Secondary | ICD-10-CM

## 2020-06-19 DIAGNOSIS — Q828 Other specified congenital malformations of skin: Secondary | ICD-10-CM

## 2020-06-19 DIAGNOSIS — M79672 Pain in left foot: Secondary | ICD-10-CM

## 2020-06-23 NOTE — Progress Notes (Signed)
Subjective:  Patient ID: Monique Benjamin, female    DOB: [birth year],  MRN: [id number]  74 y.o. female presents with painful porokeratotic lesion(s) b/l and painful mycotic toenails that limit ambulation. Aggravating factors include weightbearing with and without shoe gear. Pain for both is relieved with periodic professional debridement.   She voices no new pedal problems on today's visit.  Review of Systems: Negative except as noted in the HPI.  Past Medical History:  Diagnosis Date  . Anxiety   . Hemorrhoids   . Hyperlipidemia   . Hypertension   . Osteopenia    Past Surgical History:  Procedure Laterality Date  . NO PAST SURGERIES     Patient Active Problem List   Diagnosis Date Noted  . Family history of glaucoma 12/08/2018  . Primary open angle glaucoma of both eyes, moderate stage 12/08/2018  . Vitreous floater, bilateral 12/08/2018    Current Outpatient Medications:  .  latanoprost (XALATAN) 0.005 % ophthalmic solution, Place 1 drop into both eyes nightly., Disp: , Rfl:  .  amLODipine (NORVASC) 5 MG tablet, Take 5 mg by mouth daily., Disp: , Rfl: 8 .  aspirin 81 MG EC tablet, Take by mouth., Disp: , Rfl:  .  Bimatoprost (LUMIGAN OP), Apply to eye as directed. 1 drop into affected eye once every evening, Disp: , Rfl:  .  Calcium Carbonate-Vitamin D (CALCIUM-VITAMIN D) 500-200 MG-UNIT per tablet, Take 1 tablet by mouth 3 (three) times daily., Disp: , Rfl:  .  calcium-vitamin D (OSCAL WITH D) 500-200 MG-UNIT TABS tablet, Take by mouth., Disp: , Rfl:  .  cholecalciferol (VITAMIN D) 1000 UNITS tablet, Take 1,000 Units by mouth daily., Disp: , Rfl:  .  hydrochlorothiazide (HYDRODIURIL) 12.5 MG tablet, Take 12.5 mg by mouth every morning., Disp: , Rfl: 6 .  ketorolac (ACULAR) 0.5 % ophthalmic solution, , Disp: , Rfl:  .  latanoprost (XALATAN) 0.005 % ophthalmic solution, INSTILL 1 DROP AT BEDTIME IN BOTH EYES, Disp: , Rfl: 3 .  levofloxacin (LEVAQUIN) 500 MG tablet, Take 500  mg by mouth daily., Disp: , Rfl:  .  moxifloxacin (VIGAMOX) 0.5 % ophthalmic solution, , Disp: , Rfl:  .  omeprazole (PRILOSEC) 40 MG capsule, Take 40 mg by mouth 2 (two) times daily., Disp: , Rfl:  .  prednisoLONE acetate (PRED FORTE) 1 % ophthalmic suspension, , Disp: , Rfl:  .  rosuvastatin (CRESTOR) 10 MG tablet, Take 10 mg by mouth daily., Disp: , Rfl:  .  rosuvastatin (CRESTOR) 5 MG tablet, Take 5 mg by mouth daily., Disp: , Rfl: 7 No Known Allergies Social History   Tobacco Use  Smoking Status Never Smoker  Smokeless Tobacco Never Used   Objective:  There were no vitals filed for this visit. Constitutional Patient is a pleasant 74 y.o. African American female, WD, WN in NAD.Marland Kitchen  Vascular Neurovascular status unchanged b/l lower extremities. Capillary refill time to digits immediate b/l. Palpable pedal pulses b/l LE. Pedal hair present. Lower extremity skin temperature gradient within normal limits. Capillary refill normal to all digits.  No cyanosis or clubbing noted.  Neurologic Normal speech. Oriented to person, place, and time. Protective sensation intact 5/5 intact bilaterally with 10g monofilament b/l.  Dermatologic Pedal skin with normal turgor, texture and tone bilaterally. No open wounds bilaterally. No interdigital macerations bilaterally. Toenails 1-5 b/l elongated, discolored, dystrophic, thickened, crumbly with subungual debris and tenderness to dorsal palpation. Porokeratotic lesion(s) L hallux, R hallux, submet head 3 right foot and submet head 5  right foot. No erythema, no edema, no drainage, no flocculence.  Orthopedic: Normal muscle strength 5/5 to all lower extremity muscle groups bilaterally. No pain crepitus or joint limitation noted with ROM b/l. No gross bony deformities bilaterally.   Radiographs: None Assessment:  No diagnosis found. Plan:  Patient was evaluated and treated and all questions answered.  Onychomycosis with pain -Nails palliatively  debridement as below. -Educated on self-care  Procedure: Nail Debridement Rationale: Pain Type of Debridement: manual, sharp debridement. Instrumentation: Nail nipper, rotary burr. Number of Nails: 10  -Examined patient. -Toenails 1-5 b/l were debrided in length and girth with sterile nail nippers and dremel without iatrogenic bleeding.  -Painful porokeratotic lesion(s) L hallux, R hallux, submet head 3 right foot and submet head 5 right foot pared and enucleated with sterile scalpel blade without incident.  Return in about 3 months (around 09/19/2020) for nail and callus trim.  Freddie Breech, DPM

## 2020-09-25 ENCOUNTER — Ambulatory Visit (INDEPENDENT_AMBULATORY_CARE_PROVIDER_SITE_OTHER): Payer: Medicare Other | Admitting: Podiatry

## 2020-09-25 ENCOUNTER — Other Ambulatory Visit: Payer: Self-pay

## 2020-09-25 ENCOUNTER — Encounter: Payer: Self-pay | Admitting: Podiatry

## 2020-09-25 DIAGNOSIS — M79672 Pain in left foot: Secondary | ICD-10-CM | POA: Diagnosis not present

## 2020-09-25 DIAGNOSIS — B351 Tinea unguium: Secondary | ICD-10-CM

## 2020-09-25 DIAGNOSIS — M79671 Pain in right foot: Secondary | ICD-10-CM

## 2020-09-25 DIAGNOSIS — M79676 Pain in unspecified toe(s): Secondary | ICD-10-CM

## 2020-09-25 DIAGNOSIS — Q828 Other specified congenital malformations of skin: Secondary | ICD-10-CM

## 2020-09-29 NOTE — Progress Notes (Signed)
Subjective:  Patient ID: Monique Benjamin, female    DOB: 03-17-1946,  MRN: [id number]  Monique Benjamin presents to clinic today for painful porokeratotic lesion(s) b/l feet and painful mycotic toenails that limit ambulation. Painful toenails interfere with ambulation. Aggravating factors include wearing enclosed shoe gear. Pain is relieved with periodic professional debridement. Painful porokeratotic lesions are aggravated when weightbearing with and without shoegear. Pain is relieved with periodic professional debridement..  74 y.o. female presents with the above complaint.  She voices no new pedal concerns on today's visit.  Review of Systems: Negative except as noted in the HPI. Past Medical History:  Diagnosis Date  . Anxiety   . Hemorrhoids   . Hyperlipidemia   . Hypertension   . Osteopenia    Past Surgical History:  Procedure Laterality Date  . NO PAST SURGERIES      Current Outpatient Medications:  .  amLODipine (NORVASC) 5 MG tablet, Take 5 mg by mouth daily., Disp: , Rfl: 8 .  aspirin 81 MG EC tablet, Take by mouth., Disp: , Rfl:  .  Bimatoprost (LUMIGAN OP), Apply to eye as directed. 1 drop into affected eye once every evening, Disp: , Rfl:  .  Calcium Carbonate-Vitamin D (CALCIUM-VITAMIN D) 500-200 MG-UNIT per tablet, Take 1 tablet by mouth 3 (three) times daily., Disp: , Rfl:  .  calcium-vitamin D (OSCAL WITH D) 500-200 MG-UNIT TABS tablet, Take by mouth., Disp: , Rfl:  .  cholecalciferol (VITAMIN D) 1000 UNITS tablet, Take 1,000 Units by mouth daily., Disp: , Rfl:  .  hydrochlorothiazide (HYDRODIURIL) 12.5 MG tablet, Take 12.5 mg by mouth every morning., Disp: , Rfl: 6 .  ketorolac (ACULAR) 0.5 % ophthalmic solution, Place 1 drop into the right eye in the morning and at bedtime., Disp: , Rfl:  .  latanoprost (XALATAN) 0.005 % ophthalmic solution, INSTILL 1 DROP AT BEDTIME IN BOTH EYES, Disp: , Rfl: 3 .  levofloxacin (LEVAQUIN) 500 MG tablet, Take 500 mg by mouth  daily., Disp: , Rfl:  .  moxifloxacin (VIGAMOX) 0.5 % ophthalmic solution, , Disp: , Rfl:  .  omeprazole (PRILOSEC) 40 MG capsule, Take 40 mg by mouth 2 (two) times daily., Disp: , Rfl:  .  prednisoLONE acetate (PRED FORTE) 1 % ophthalmic suspension, Place 1 drop into the right eye 2 times daily., Disp: , Rfl:  .  rosuvastatin (CRESTOR) 10 MG tablet, Take 10 mg by mouth daily., Disp: , Rfl:  .  rosuvastatin (CRESTOR) 5 MG tablet, Take 5 mg by mouth daily., Disp: , Rfl: 7 .  timolol (TIMOPTIC) 0.5 % ophthalmic solution, 1 drop every morning., Disp: , Rfl:  No Known Allergies Social History   Occupational History  . Occupation: Retired    Associate Professor: RETIRED  Tobacco Use  . Smoking status: Never Smoker  . Smokeless tobacco: Never Used  Substance and Sexual Activity  . Alcohol use: No  . Drug use: No  . Sexual activity: Not on file    Objective:   [name] is a pleasant 74 y.o. African American female, WD, WN in NAD.Marland Kitchen AAO x 3.   Vascular Capillary refill time to digits immediate b/l. Palpable pedal pulses b/l LE. Pedal hair present. Lower extremity skin temperature gradient within normal limits. No pain with calf compression b/l. No edema noted b/l lower extremities. No cyanosis or clubbing noted.  Neurologic Normal speech. Oriented to person, place, and time. Protective sensation intact 5/5 intact bilaterally with 10g monofilament b/l. Vibratory sensation intact  b/l. Proprioception intact bilaterally.  Dermatologic Pedal skin with normal turgor, texture and tone bilaterally. No open wounds bilaterally. No interdigital macerations bilaterally. Toenails 1-5 b/l elongated, discolored, dystrophic, thickened, crumbly with subungual debris and tenderness to dorsal palpation. Porokeratotic lesion(s) L hallux, R hallux, submet head 3 right foot and submet head 5 right foot. No erythema, no edema, no drainage, no flocculence.  Orthopedic: Normal muscle strength 5/5 to all lower  extremity muscle groups bilaterally. No pain crepitus or joint limitation noted with ROM b/l. No gross bony deformities bilaterally. Patient ambulates independent of any assistive aids.   Radiographs: None Assessment:   1. Pain due to onychomycosis of toenail   2. Porokeratosis   3. Pain in both feet    Plan:  Patient was evaluated and treated and all questions answered.  Onychomycosis with pain -Nails palliatively debridement as below -Educated on self-care  Procedure: Nail Debridement Rationale: Pain Type of Debridement: manual, sharp debridement. Instrumentation: Nail nipper, rotary burr. Number of Nails: 10 -Examined patient. -No new findings. No new orders. -Toenails 1-5 b/l were debrided in length and girth with sterile nail nippers and dremel without iatrogenic bleeding.  -Painful porokeratotic lesion(s) L hallux, R hallux, submet head 3 right foot and submet head 5 right foot pared and enucleated with sterile scalpel blade without incident. -Patient to report any pedal injuries to medical professional immediately. -Patient to continue soft, supportive shoe gear daily. -Patient/POA to call should there be question/concern in the interim.  Return in about 3 months (around 12/26/2020).  Freddie Breech, DPM

## 2021-01-01 ENCOUNTER — Encounter: Payer: Self-pay | Admitting: Podiatry

## 2021-01-01 ENCOUNTER — Ambulatory Visit (INDEPENDENT_AMBULATORY_CARE_PROVIDER_SITE_OTHER): Payer: Medicare Other | Admitting: Podiatry

## 2021-01-01 ENCOUNTER — Other Ambulatory Visit: Payer: Self-pay

## 2021-01-01 DIAGNOSIS — M79671 Pain in right foot: Secondary | ICD-10-CM

## 2021-01-01 DIAGNOSIS — M79676 Pain in unspecified toe(s): Secondary | ICD-10-CM | POA: Diagnosis not present

## 2021-01-01 DIAGNOSIS — Q828 Other specified congenital malformations of skin: Secondary | ICD-10-CM | POA: Diagnosis not present

## 2021-01-01 DIAGNOSIS — B351 Tinea unguium: Secondary | ICD-10-CM | POA: Diagnosis not present

## 2021-01-01 DIAGNOSIS — M79672 Pain in left foot: Secondary | ICD-10-CM

## 2021-01-05 NOTE — Progress Notes (Signed)
Subjective:  Patient ID: Monique Benjamin, female    DOB: [birth year],  MRN: [id number]  Monique Benjamin presents to clinic today for painful porokeratotic lesion(s) b/l feet and painful mycotic toenails that limit ambulation. Painful toenails interfere with ambulation. Aggravating factors include wearing enclosed shoe gear. Pain is relieved with periodic professional debridement. Painful porokeratotic lesions are aggravated when weightbearing with and without shoegear. Pain is relieved with periodic professional debridement..  75 y.o. female presents with the above complaint.  She voices no new pedal concerns on today's visit.  PCP is Dr. Maurice Small.  Review of Systems: Negative except as noted in the HPI. Past Medical History:  Diagnosis Date  . Anxiety   . Hemorrhoids   . Hyperlipidemia   . Hypertension   . Osteopenia    Past Surgical History:  Procedure Laterality Date  . NO PAST SURGERIES      Current Outpatient Medications:  .  ketorolac (ACULAR) 0.5 % ophthalmic solution, Place 1 drop into the right eye 3 times daily., Disp: , Rfl:  .  prednisoLONE acetate (PRED FORTE) 1 % ophthalmic suspension, Place 1 drop into the right eye 3 times daily., Disp: , Rfl:  .  timolol (TIMOPTIC) 0.5 % ophthalmic solution, PLACE 1 DROP INTO BOTH EYES IN THE MORNING., Disp: , Rfl:  .  amLODipine (NORVASC) 5 MG tablet, Take 5 mg by mouth daily., Disp: , Rfl: 8 .  aspirin 81 MG EC tablet, Take by mouth., Disp: , Rfl:  .  Bimatoprost (LUMIGAN OP), Apply to eye as directed. 1 drop into affected eye once every evening, Disp: , Rfl:  .  Calcium Carbonate-Vitamin D (CALCIUM-VITAMIN D) 500-200 MG-UNIT per tablet, Take 1 tablet by mouth 3 (three) times daily., Disp: , Rfl:  .  calcium-vitamin D (OSCAL WITH D) 500-200 MG-UNIT TABS tablet, Take by mouth., Disp: , Rfl:  .  cholecalciferol (VITAMIN D) 1000 UNITS tablet, Take 1,000 Units by mouth daily., Disp: , Rfl:  .  hydrochlorothiazide  (HYDRODIURIL) 12.5 MG tablet, Take 12.5 mg by mouth every morning., Disp: , Rfl: 6 .  latanoprost (XALATAN) 0.005 % ophthalmic solution, INSTILL 1 DROP AT BEDTIME IN BOTH EYES, Disp: , Rfl: 3 .  levofloxacin (LEVAQUIN) 500 MG tablet, Take 500 mg by mouth daily., Disp: , Rfl:  .  moxifloxacin (VIGAMOX) 0.5 % ophthalmic solution, , Disp: , Rfl:  .  omeprazole (PRILOSEC) 40 MG capsule, Take 40 mg by mouth 2 (two) times daily., Disp: , Rfl:  .  rosuvastatin (CRESTOR) 10 MG tablet, Take 10 mg by mouth daily., Disp: , Rfl:  .  rosuvastatin (CRESTOR) 5 MG tablet, Take 5 mg by mouth daily., Disp: , Rfl: 7 No Known Allergies Social History   Occupational History  . Occupation: Retired    Associate Professor: RETIRED  Tobacco Use  . Smoking status: Never Smoker  . Smokeless tobacco: Never Used  Substance and Sexual Activity  . Alcohol use: No  . Drug use: No  . Sexual activity: Not on file    Objective:   Constitutional CANYON WILLOW is a pleasant 75 y.o. African American female, WD, WN in NAD.Marland Kitchen AAO x 3.   Vascular Capillary refill time to digits immediate b/l. Palpable pedal pulses b/l LE. Pedal hair present. Lower extremity skin temperature gradient within normal limits. No pain with calf compression b/l. No edema noted b/l lower extremities. No cyanosis or clubbing noted.  Neurologic Normal speech. Oriented to person, place, and time. Protective sensation intact 5/5 intact  bilaterally with 10g monofilament b/l. Vibratory sensation intact b/l. Proprioception intact bilaterally.  Dermatologic Pedal skin with normal turgor, texture and tone bilaterally. No open wounds bilaterally. No interdigital macerations bilaterally. Toenails 1-5 b/l elongated, discolored, dystrophic, thickened, crumbly with subungual debris and tenderness to dorsal palpation. Porokeratotic lesion(s) L hallux, R hallux, submet head 3 right foot and submet head 5 right foot. No erythema, no edema, no drainage, no flocculence.   Orthopedic: Normal muscle strength 5/5 to all lower extremity muscle groups bilaterally. No pain crepitus or joint limitation noted with ROM b/l. No gross bony deformities bilaterally. Patient ambulates independent of any assistive aids.   Radiographs: None Assessment:   1. Pain due to onychomycosis of toenail   2. Porokeratosis   3. Pain in both feet    Plan:  Patient was evaluated and treated and all questions answered.  Onychomycosis with pain -Nails palliatively debridement as below -Educated on self-care  Procedure: Nail Debridement Rationale: Pain Type of Debridement: manual, sharp debridement. Instrumentation: Nail nipper, rotary burr. Number of Nails: 10 -Examined patient. -No new findings. No new orders. -Toenails 1-5 b/l were debrided in length and girth with sterile nail nippers and dremel without iatrogenic bleeding.  -Painful porokeratotic lesion(s) L hallux, R hallux, submet head 3 right foot and submet head 5 right foot pared and enucleated with sterile scalpel blade without incident. -Patient to report any pedal injuries to medical professional immediately. -Patient to continue soft, supportive shoe gear daily. -Patient/POA to call should there be question/concern in the interim.  Return in about 3 months (around 04/01/2021).  Freddie Breech, DPM

## 2021-03-11 ENCOUNTER — Encounter: Payer: Self-pay | Admitting: Podiatrist

## 2021-03-11 ENCOUNTER — Ambulatory Visit (INDEPENDENT_AMBULATORY_CARE_PROVIDER_SITE_OTHER): Payer: Medicare Other | Admitting: Podiatrist

## 2021-03-11 ENCOUNTER — Other Ambulatory Visit: Payer: Self-pay

## 2021-03-11 ENCOUNTER — Ambulatory Visit (INDEPENDENT_AMBULATORY_CARE_PROVIDER_SITE_OTHER): Payer: Medicare Other

## 2021-03-11 DIAGNOSIS — M79672 Pain in left foot: Secondary | ICD-10-CM | POA: Diagnosis not present

## 2021-03-11 DIAGNOSIS — M79671 Pain in right foot: Secondary | ICD-10-CM

## 2021-03-11 DIAGNOSIS — S90212A Contusion of left great toe with damage to nail, initial encounter: Secondary | ICD-10-CM

## 2021-03-11 DIAGNOSIS — S90122A Contusion of left lesser toe(s) without damage to nail, initial encounter: Secondary | ICD-10-CM

## 2021-03-11 DIAGNOSIS — L03031 Cellulitis of right toe: Secondary | ICD-10-CM

## 2021-03-11 NOTE — Patient Instructions (Signed)

## 2021-03-11 NOTE — Progress Notes (Unsigned)
HPI: Patient is 75 y.o. female who presents today for pain and swelling in the left great toe after a wheelchair ran over her foot this past Saturday night.  She relates the toe became red and swollen and she has had a pounding heart beat type pain in the toe especially at night  She has tried ice with no relief in symptoms.  She relates no drainage   Patient Active Problem List   Diagnosis Date Noted  . Family history of glaucoma 12/08/2018  . Primary open angle glaucoma of both eyes, moderate stage 12/08/2018  . Vitreous floater, bilateral 12/08/2018    Current Outpatient Medications on File Prior to Visit  Medication Sig Dispense Refill  . amLODipine (NORVASC) 5 MG tablet Take 5 mg by mouth daily.  8  . aspirin 81 MG EC tablet Take by mouth.    . Bimatoprost (LUMIGAN OP) Apply to eye as directed. 1 drop into affected eye once every evening    . Calcium Carbonate-Vitamin D (CALCIUM-VITAMIN D) 500-200 MG-UNIT per tablet Take 1 tablet by mouth 3 (three) times daily.    . calcium-vitamin D (OSCAL WITH D) 500-200 MG-UNIT TABS tablet Take by mouth.    . cholecalciferol (VITAMIN D) 1000 UNITS tablet Take 1,000 Units by mouth daily.    . hydrochlorothiazide (HYDRODIURIL) 12.5 MG tablet Take 12.5 mg by mouth every morning.  6  . ketorolac (ACULAR) 0.5 % ophthalmic solution Place 1 drop into the right eye 3 times daily.    Marland Kitchen latanoprost (XALATAN) 0.005 % ophthalmic solution INSTILL 1 DROP AT BEDTIME IN BOTH EYES  3  . levofloxacin (LEVAQUIN) 500 MG tablet Take 500 mg by mouth daily.    Marland Kitchen moxifloxacin (VIGAMOX) 0.5 % ophthalmic solution     . omeprazole (PRILOSEC) 40 MG capsule Take 40 mg by mouth 2 (two) times daily.    . prednisoLONE acetate (PRED FORTE) 1 % ophthalmic suspension Place 1 drop into the right eye 3 times daily.    . rosuvastatin (CRESTOR) 10 MG tablet Take 10 mg by mouth daily.    . rosuvastatin (CRESTOR) 5 MG tablet Take 5 mg by mouth daily.  7  . timolol (TIMOPTIC) 0.5 %  ophthalmic solution PLACE 1 DROP INTO BOTH EYES IN THE MORNING.     No current facility-administered medications on file prior to visit.    No Known Allergies  Review of Systems No fevers, chills, nausea, muscle aches, no difficulty breathing, no calf pain, no chest pain or shortness of breath.   Physical Exam  GENERAL APPEARANCE: Alert, conversant. Appropriately groomed. No acute distress.   VASCULAR: Pedal pulses palpable DP and PT bilateral.  Capillary refill time is immediate to all digits,  Proximal to distal cooling it warm to warm.  Digital perfusion adequate.   NEUROLOGIC: sensation is intact to 5.07 monofilament at 5/5 sites bilateral.  Light touch is intact bilateral, vibratory sensation intact bilateral  MUSCULOSKELETAL: acceptable muscle strength, tone and stability bilateral.  No gross boney pedal deformities noted.  No pain, crepitus or limitation noted with foot and ankle range of motion bilateral.   DERMATOLOGIC: skin is warm, supple, and dry. Left great toe is shiny from swelling and red.  The foot is also swollen.  The left hallux nail appears to have drainage and a hematoma built up beneath the nail.  Significant pain with any touch is noted.    xrays are negative for fracture.  No acute osseous abnormalities are present.  Swelling of  the soft tissues is seen at the hallux left.   Assessment    ICD-10-CM   1. Contusion of left great toe with damage to nail, initial encounter  S90.212A   2. Pain in both feet  M79.671 DG Foot Complete Left   M79.672   3. Hematoma of toe of left foot, initial encounter  C16.606T      Plan  Discussed exam findings and xray findings and recommended removing the nail to evacuate the hemotoma.  The patient agreed.  I prepped the skin with alcohol and performed a digital block on the hallux with lidocaine and marcaine plain.  I then cleansed the toe well with betadine.  I did not use a tournoquet. After anesthesia was achieved, I  carefully freed the nail from the nail bed which released a pirulent and bloody discharge from beneath the nail.  The nail was then carefully removed and all discharge was cleansed off.  The area was then cleansed well with betadine and hydrogen peroxide mixture.  Antibiotic ointment and dry sterile dressing was applied.  She will follow up for a recheck and will call if any concerns arise.

## 2021-03-18 ENCOUNTER — Other Ambulatory Visit: Payer: Self-pay | Admitting: Podiatrist

## 2021-03-18 DIAGNOSIS — S90212A Contusion of left great toe with damage to nail, initial encounter: Secondary | ICD-10-CM

## 2021-03-24 ENCOUNTER — Ambulatory Visit: Payer: Medicare Other | Admitting: Podiatrist

## 2021-03-25 ENCOUNTER — Ambulatory Visit: Payer: Medicare Other | Admitting: Podiatrist

## 2021-04-02 ENCOUNTER — Encounter: Payer: Self-pay | Admitting: Podiatry

## 2021-04-02 ENCOUNTER — Other Ambulatory Visit: Payer: Self-pay

## 2021-04-02 ENCOUNTER — Ambulatory Visit (INDEPENDENT_AMBULATORY_CARE_PROVIDER_SITE_OTHER): Payer: Medicare Other | Admitting: Podiatry

## 2021-04-02 ENCOUNTER — Ambulatory Visit: Payer: Medicare Other | Admitting: Podiatry

## 2021-04-02 DIAGNOSIS — S90122A Contusion of left lesser toe(s) without damage to nail, initial encounter: Secondary | ICD-10-CM | POA: Diagnosis not present

## 2021-04-02 DIAGNOSIS — S90212A Contusion of left great toe with damage to nail, initial encounter: Secondary | ICD-10-CM

## 2021-04-02 NOTE — Progress Notes (Signed)
Subjective:   Patient ID: Monique Benjamin, female   DOB: 75 y.o.   MRN: [id number]   HPI Patient presents stating she has doing better she still having some discomfort she wanted it checked   ROS      Objective:  Physical Exam  Neurovascular status intact with patient's left hallux nail was removed there is still some healing with some crusted tissue but the pain has receded and minimal edema     Assessment:  Doing well after having trauma to the left hallux causing loss of toenail     Plan:  H&P reviewed condition and explained that toenail will regrow and there is no guarantee will grow back normal but we will watch that as it occurs and patient will be seen back to recheck

## 2021-04-03 ENCOUNTER — Ambulatory Visit: Payer: Medicare Other | Admitting: Podiatry

## 2021-04-21 ENCOUNTER — Other Ambulatory Visit: Payer: Self-pay

## 2021-04-21 ENCOUNTER — Ambulatory Visit (INDEPENDENT_AMBULATORY_CARE_PROVIDER_SITE_OTHER): Payer: Medicare Other | Admitting: Podiatry

## 2021-04-21 DIAGNOSIS — M79676 Pain in unspecified toe(s): Secondary | ICD-10-CM

## 2021-04-21 DIAGNOSIS — M79671 Pain in right foot: Secondary | ICD-10-CM

## 2021-04-21 DIAGNOSIS — B351 Tinea unguium: Secondary | ICD-10-CM

## 2021-04-21 DIAGNOSIS — Q828 Other specified congenital malformations of skin: Secondary | ICD-10-CM

## 2021-04-21 DIAGNOSIS — M79672 Pain in left foot: Secondary | ICD-10-CM

## 2021-04-24 ENCOUNTER — Encounter: Payer: Self-pay | Admitting: Podiatry

## 2021-04-24 NOTE — Progress Notes (Signed)
  Subjective:  Patient ID: Monique Benjamin, female    DOB: Sep 22, 1946,  MRN: [id number]  75 y.o. female presents painful thick toenails that are difficult to trim. Pain interferes with ambulation. Aggravating factors include wearing enclosed shoe gear. Pain is relieved with periodic professional debridement..  Pain interferes with ambulation. Aggravating factors include wearing enclosed shoe gear. Pain is relieved with periodic professional debridement.  No Known Allergies  Review of Systems: Negative except as noted in the HPI.  Objective:   Constitutional Pt is a pleasant 75 y.o. African American female in NAD. AAO x 3.   Vascular Capillary refill time to digits immediate b/l. Palpable pedal pulses b/l LE. Pedal hair present. Lower extremity skin temperature gradient within normal limits. No pain with calf compression b/l.  Neurologic Protective sensation intact 5/5 intact bilaterally with 10g monofilament b/l.  Dermatologic Pedal skin with normal turgor, texture and tone bilaterally. No open wounds bilaterally. No interdigital macerations bilaterally. Toenails 1-5 b/l elongated, discolored, dystrophic, thickened, crumbly with subungual debris and tenderness to dorsal palpation. Porokeratotic lesion(s) L hallux, R hallux, submet head 3 right foot and submet head 5 right foot. No erythema, no edema, no drainage, no fluctuance.  Orthopedic: Normal muscle strength 5/5 to all lower extremity muscle groups bilaterally. No pain crepitus or joint limitation noted with ROM b/l. No gross bony deformities bilaterally. Patient ambulates independent of any assistive aids.   Radiographs: None Assessment:   1. Pain due to onychomycosis of toenail   2. Porokeratosis   3. Pain in both feet    Plan:  Patient was evaluated and treated and all questions answered.  Onychomycosis with pain -Nails palliatively debridement as below. -Educated on self-care  Procedure: Nail Debridement Rationale: Pain Type  of Debridement: manual, sharp debridement. Instrumentation: Nail nipper, rotary burr. Number of Nails: 10  -Examined patient. -Patient to continue soft, supportive shoe gear daily. -Toenails 1-5 b/l were debrided in length and girth with sterile nail nippers and dremel without iatrogenic bleeding.  -Porokeratosis b/l hallux, submet head 3 right foot, and submet head 5 right foot pared and enucleated with sterile scalpel blade without incident. -Patient to report any pedal injuries to medical professional immediately. -Patient/POA to call should there be question/concern in the interim.  Return in about 3 months (around 07/22/2021).  Freddie Breech, DPM

## 2021-05-13 ENCOUNTER — Emergency Department (HOSPITAL_BASED_OUTPATIENT_CLINIC_OR_DEPARTMENT_OTHER)
Admission: EM | Admit: 2021-05-13 | Discharge: 2021-05-13 | Disposition: A | Discharge status: Home / Self Care | Payer: Medicare Other | Attending: Emergency Medicine | Admitting: Emergency Medicine

## 2021-05-13 ENCOUNTER — Encounter (HOSPITAL_BASED_OUTPATIENT_CLINIC_OR_DEPARTMENT_OTHER): Payer: Self-pay

## 2021-05-13 ENCOUNTER — Emergency Department (HOSPITAL_BASED_OUTPATIENT_CLINIC_OR_DEPARTMENT_OTHER): Payer: Medicare Other

## 2021-05-13 ENCOUNTER — Other Ambulatory Visit: Payer: Self-pay

## 2021-05-13 DIAGNOSIS — Z79899 Other long term (current) drug therapy: Secondary | ICD-10-CM | POA: Diagnosis not present

## 2021-05-13 DIAGNOSIS — Z7982 Long term (current) use of aspirin: Secondary | ICD-10-CM | POA: Diagnosis not present

## 2021-05-13 DIAGNOSIS — I10 Essential (primary) hypertension: Secondary | ICD-10-CM | POA: Insufficient documentation

## 2021-05-13 DIAGNOSIS — M542 Cervicalgia: Secondary | ICD-10-CM | POA: Diagnosis present

## 2021-05-13 DIAGNOSIS — R52 Pain, unspecified: Secondary | ICD-10-CM

## 2021-05-13 MED ORDER — ACETAMINOPHEN 325 MG PO TABS
650.0000 mg | ORAL_TABLET | Freq: Once | ORAL | Status: AC
  Administered 2021-05-13: 650 mg via ORAL
  Filled 2021-05-13: qty 2

## 2021-05-13 NOTE — ED Notes (Signed)
Pt reports she does not take her blood pressure medication every day. Advised patient that she should take this medication as directed.

## 2021-05-13 NOTE — ED Provider Notes (Signed)
[facility] EMERGENCY DEPARTMENT Provider Note  CSN: [id number] Arrival date & time: 05/13/21 1829    History Chief Complaint  Patient presents with  . Neck Pain    HPI  Monique Benjamin is a 75 y.o. female with history of HTN, HLD reports she has had mild-moderate neck pain since earlier this afternoon. Started when she turned her head while driving. Comes and goes with movement but is not constant. No recent falls or injuries. She has not had any pain radiating down her arms. No numbness tingling or weakness. No fever, sore throat, dysphagia, CP, SOB, N/V/D. Did not try taking any medications before coming to the ED.    Past Medical History:  Diagnosis Date  . Anxiety   . Hemorrhoids   . Hyperlipidemia   . Hypertension   . Osteopenia     Past Surgical History:  Procedure Laterality Date  . NO PAST SURGERIES      Family History  Problem Relation Age of Onset  . Heart disease Mother   . Diabetes Mother   . Heart failure Father   . Hepatitis C Sister   . Breast cancer Sister 71  . Breast cancer Sister     Social History   Tobacco Use  . Smoking status: Never Smoker  . Smokeless tobacco: Never Used  Substance Use Topics  . Alcohol use: No  . Drug use: No     Home Medications Prior to Admission medications   Medication Sig Start Date End Date Taking? Authorizing Provider  amLODipine (NORVASC) 5 MG tablet Take 5 mg by mouth daily. 09/30/18   [provider]  aspirin 81 MG EC tablet Take by mouth.    [provider]  Bimatoprost (LUMIGAN OP) Apply to eye as directed. 1 drop into affected eye once every evening    [provider]  Calcium Carbonate-Vitamin D (CALCIUM-VITAMIN D) 500-200 MG-UNIT per tablet Take 1 tablet by mouth 3 (three) times daily.    [provider]  calcium-vitamin D (OSCAL WITH D) 500-200 MG-UNIT TABS tablet Take by mouth.    [provider]  cholecalciferol (VITAMIN D) 1000 UNITS  tablet Take 1,000 Units by mouth daily.    [provider]  hydrochlorothiazide (HYDRODIURIL) 12.5 MG tablet Take 12.5 mg by mouth every morning. 09/09/18   [provider]  ketorolac (ACULAR) 0.5 % ophthalmic solution Place 1 drop into the right eye 3 times daily. 12/31/20   [provider]  latanoprost (XALATAN) 0.005 % ophthalmic solution INSTILL 1 DROP AT BEDTIME IN BOTH EYES 10/24/15   [provider]  levofloxacin (LEVAQUIN) 500 MG tablet Take 500 mg by mouth daily. 03/07/20   [provider]  moxifloxacin (VIGAMOX) 0.5 % ophthalmic solution  05/23/20   [provider]  omeprazole (PRILOSEC) 40 MG capsule Take 40 mg by mouth 2 (two) times daily.    [provider]  prednisoLONE acetate (PRED FORTE) 1 % ophthalmic suspension Place 1 drop into the right eye 3 times daily. 12/31/20   [provider]  rosuvastatin (CRESTOR) 10 MG tablet Take 10 mg by mouth daily. 04/24/20   [provider]  rosuvastatin (CRESTOR) 5 MG tablet Take 5 mg by mouth daily. 07/27/18   [provider]  timolol (TIMOPTIC) 0.5 % ophthalmic solution PLACE 1 DROP INTO BOTH EYES IN THE MORNING. 10/07/20   [provider]     Allergies    Patient has no known allergies.   Review of  Systems   Review of Systems A comprehensive review of systems was completed and negative except as noted in HPI.    Physical Exam BP (!) 173/105 (BP Location: Left Arm)   Pulse 74   Temp 98.4 F (36.9 C) (Oral)   Resp 18   Ht 5\' 5"  (1.651 m)   Wt 59 kg   SpO2 100%   BMI 21.63 kg/m   Physical Exam Vitals and nursing note reviewed.  Constitutional:      Appearance: Normal appearance.  HENT:     Head: Normocephalic and atraumatic.     Nose: Nose normal.     Mouth/Throat:     Mouth: Mucous membranes are moist.  Eyes:     Extraocular Movements: Extraocular movements intact.     Conjunctiva/sclera: Conjunctivae normal.  Neck:     Comments:  No thyromegaly Cardiovascular:     Rate and Rhythm: Normal rate.  Pulmonary:     Effort: Pulmonary effort is normal.     Breath sounds: Normal breath sounds.  Abdominal:     General: Abdomen is flat.     Palpations: Abdomen is soft.     Tenderness: There is no abdominal tenderness.  Musculoskeletal:        General: No swelling. Normal range of motion.     Cervical back: Normal range of motion and neck supple. Tenderness (mild, soft tissue L lateral neck) present. No rigidity.  Lymphadenopathy:     Cervical: No cervical adenopathy.  Skin:    General: Skin is warm and dry.  Neurological:     General: No focal deficit present.     Mental Status: She is alert.  Psychiatric:        Mood and Affect: Mood normal.      ED Results / Procedures / Treatments   Labs (all labs ordered are listed, but only abnormal results are displayed) Labs Reviewed - No data to display  EKG None   Radiology DG Cervical Spine Complete  Result Date: 05/13/2021 CLINICAL DATA:  Progressive neck stiffness EXAM: CERVICAL SPINE - COMPLETE 4+ VIEW COMPARISON:  None. FINDINGS: Normal cervical lordosis. The lateral masses of C1 are well aligned with the body of C2. No acute fracture or listhesis of the cervical spine. There is intervertebral disc space narrowing and endplate remodeling of C4-T1 in keeping with changes of moderate degenerative disc disease. The spinal canal is widely patent. The prevertebral soft tissues are not thickened. Oblique views do not optimally profiled the neural foramina bilaterally. IMPRESSION: Moderate degenerative disc disease C4-T1. Electronically Signed   By: 05/15/2021 MD   On: 05/13/2021 19:42    Procedures Procedures  Medications Ordered in the ED Medications  acetaminophen (TYLENOL) tablet 650 mg (650 mg Oral Given 05/13/21 1911)     MDM Rules/Calculators/A&P MDM Will check plain xray, give APAP for pain and provide a heat pack for comfort.  ED Course  I have  reviewed the triage vital signs and the nursing notes.  Pertinent labs & imaging results that were available during my care of the patient were reviewed by me and considered in my medical decision making (see chart for details).  Clinical Course as of 05/13/21 2007  Tue May 13, 2021  2005 Patient reports some improvement with APAP. Reviewed xray results with patient including finding of disk disease. Recommend she follow up with PCP for further evaluation, APAP/warm compress at home. RTED for any other concerns.  [CS]    Clinical Course User Index [CS]  Pollyann Savoy, MD    Final Clinical Impression(s) / ED Diagnoses Final diagnoses:  Neck pain    Rx / DC Orders ED Discharge Orders    None       Pollyann Savoy, MD 05/13/21 2007

## 2021-05-13 NOTE — ED Triage Notes (Signed)
Pt states that she is having stiffness sin both sides of her neck noticeably more when turning her head. Denies injury.

## 2021-05-13 NOTE — ED Triage Notes (Signed)
Pt arrives ambulatory to ED with c/o neck pain. Triage delayed r/t pt. Needing to use restroom upon arrival.

## 2021-07-22 ENCOUNTER — Ambulatory Visit (INDEPENDENT_AMBULATORY_CARE_PROVIDER_SITE_OTHER): Payer: Medicare Other | Admitting: Podiatry

## 2021-07-22 ENCOUNTER — Other Ambulatory Visit: Payer: Self-pay

## 2021-07-22 ENCOUNTER — Encounter: Payer: Self-pay | Admitting: Podiatry

## 2021-07-22 DIAGNOSIS — Q828 Other specified congenital malformations of skin: Secondary | ICD-10-CM | POA: Diagnosis not present

## 2021-07-22 DIAGNOSIS — B351 Tinea unguium: Secondary | ICD-10-CM

## 2021-07-22 DIAGNOSIS — M79671 Pain in right foot: Secondary | ICD-10-CM | POA: Diagnosis not present

## 2021-07-22 DIAGNOSIS — M79676 Pain in unspecified toe(s): Secondary | ICD-10-CM | POA: Diagnosis not present

## 2021-07-22 DIAGNOSIS — M79672 Pain in left foot: Secondary | ICD-10-CM | POA: Diagnosis not present

## 2021-07-26 NOTE — Progress Notes (Signed)
Subjective: Monique Benjamin is a pleasant 75 y.o. female patient seen today for painful porokeratotic lesions plantar aspect of both painful thick toenails that are difficult to trim. Pain interferes with ambulation. Aggravating factors include wearing enclosed shoe gear. Pain is relieved with periodic professional debridement.  PCP is Maurice Small, MD. Last visit was: 2 months ago.  No Known Allergies  Objective: Physical Exam  General: Monique Benjamin is a pleasant 75 y.o. African American female, WD, WN in NAD. AAO x 3.   Vascular:  Capillary refill time to digits immediate b/l. Palpable DP pulse(s) b/l lower extremities Palpable PT pulse(s) b/l lower extremities Pedal hair present. Lower extremity skin temperature gradient within normal limits. No edema noted b/l lower extremities.  Dermatological:  Pedal skin with normal turgor, texture and tone b/l lower extremities. No open wounds b/l lower extremities. No interdigital macerations b/l lower extremities. Toenails 1-5 b/l elongated, discolored, dystrophic, thickened, crumbly with subungual debris and tenderness to dorsal palpation. Porokeratotic lesion(s) L hallux, R hallux, submet head 3 right foot, and submet head 5 right foot. No erythema, no edema, no drainage, no fluctuance.  Musculoskeletal:  Normal muscle strength 5/5 to all lower extremity muscle groups bilaterally. Hammertoe(s) noted to the b/l lower extremities. Patient ambulates independent of any assistive aids.  Neurological:  Protective sensation intact 5/5 intact bilaterally with 10g monofilament b/l.  Assessment and Plan:  1. Pain due to onychomycosis of toenail   2. Porokeratosis   3. Pain in both feet   -Examined patient. -Patient to continue soft, supportive shoe gear daily. -Toenails 1-5 b/l were debrided in length and girth with sterile nail nippers and dremel without iatrogenic bleeding.  -Painful porokeratotic lesion(s) L hallux, R hallux, submet head  3 right foot, and submet head 5 right foot pared and enucleated with sterile scalpel blade without incident. Total number of lesions debrided=4. -Patient to report any pedal injuries to medical professional immediately. -Patient/POA to call should there be question/concern in the interim.  Return in about 3 months (around 10/22/2021).  Freddie Breech, DPM

## 2021-08-20 ENCOUNTER — Other Ambulatory Visit: Payer: Self-pay | Admitting: Family Medicine

## 2021-08-20 DIAGNOSIS — Z1231 Encounter for screening mammogram for malignant neoplasm of breast: Secondary | ICD-10-CM

## 2021-08-20 DIAGNOSIS — M8580 Other specified disorders of bone density and structure, unspecified site: Secondary | ICD-10-CM

## 2021-10-28 ENCOUNTER — Ambulatory Visit (INDEPENDENT_AMBULATORY_CARE_PROVIDER_SITE_OTHER): Payer: Medicare Other | Admitting: Podiatry

## 2021-10-28 ENCOUNTER — Other Ambulatory Visit: Payer: Self-pay

## 2021-10-28 DIAGNOSIS — M79671 Pain in right foot: Secondary | ICD-10-CM

## 2021-10-28 DIAGNOSIS — Q828 Other specified congenital malformations of skin: Secondary | ICD-10-CM

## 2021-10-28 DIAGNOSIS — B351 Tinea unguium: Secondary | ICD-10-CM

## 2021-10-28 DIAGNOSIS — M79672 Pain in left foot: Secondary | ICD-10-CM | POA: Diagnosis not present

## 2021-10-28 DIAGNOSIS — M79676 Pain in unspecified toe(s): Secondary | ICD-10-CM

## 2021-11-02 ENCOUNTER — Encounter: Payer: Self-pay | Admitting: Podiatry

## 2021-11-02 DIAGNOSIS — M9390 Osteochondropathy, unspecified of unspecified site: Secondary | ICD-10-CM | POA: Insufficient documentation

## 2021-11-02 DIAGNOSIS — I10 Essential (primary) hypertension: Secondary | ICD-10-CM | POA: Insufficient documentation

## 2021-11-02 DIAGNOSIS — G629 Polyneuropathy, unspecified: Secondary | ICD-10-CM | POA: Insufficient documentation

## 2021-11-02 DIAGNOSIS — I872 Venous insufficiency (chronic) (peripheral): Secondary | ICD-10-CM | POA: Insufficient documentation

## 2021-11-02 DIAGNOSIS — Z800 Family history of malignant neoplasm of digestive organs: Secondary | ICD-10-CM | POA: Insufficient documentation

## 2021-11-02 DIAGNOSIS — I739 Peripheral vascular disease, unspecified: Secondary | ICD-10-CM | POA: Insufficient documentation

## 2021-11-02 DIAGNOSIS — E7800 Pure hypercholesterolemia, unspecified: Secondary | ICD-10-CM | POA: Insufficient documentation

## 2021-11-02 DIAGNOSIS — R269 Unspecified abnormalities of gait and mobility: Secondary | ICD-10-CM | POA: Insufficient documentation

## 2021-11-02 NOTE — Progress Notes (Signed)
  Subjective:  Patient ID: Monique Benjamin, female    DOB: [birth year],  MRN: [id number]  Monique Benjamin presents to clinic today for painful porokeratotic lesion(s) of both feet and painful mycotic toenails that limit ambulation. Painful toenails interfere with ambulation. Aggravating factors include wearing enclosed shoe gear. Pain is relieved with periodic professional debridement. Painful porokeratotic lesions are aggravated when weightbearing with and without shoegear. Pain is relieved with periodic professional debridement.  PCP is Maurice Small, MD , and last visit was 08/20/2021.  She voices no new pedal problems on today's visit.  No Known Allergies  Review of Systems: Negative except as noted in the HPI. Objective:   [name] is a pleasant 75 y.o. African American female, WD, WN in NAD. AAO x 3.   Vascular CFT immediate b/l LE. Palpable DP/PT pulses b/l LE. Digital hair sparse b/l. Skin temperature gradient WNL b/l. No pain with calf compression b/l. No edema noted b/l. Lower extremity skin temperature gradient within normal limits. No cyanosis or clubbing noted.  Neurologic Normal speech. Oriented to person, place, and time. Protective sensation intact 5/5 intact bilaterally with 10g monofilament b/l. Vibratory sensation intact b/l. Proprioception intact bilaterally.  Dermatologic Pedal integument with normal turgor, texture and tone BLE. No open wounds b/l LE. No interdigital macerations noted b/l LE. Toenails 1-5 b/l elongated, discolored, dystrophic, thickened, crumbly with subungual debris and tenderness to dorsal palpation. Porokeratotic lesion(s) L hallux, R hallux, submet head 3 right foot, and submet head 5 right foot. No erythema, no edema, no drainage, no fluctuance.  Orthopedic: Normal muscle strength 5/5 to all lower extremity muscle groups bilaterally. Hammertoe deformity noted 2-5 b/l.   Radiographs: None Assessment:   1. Pain due to  onychomycosis of toenail   2. Porokeratosis   3. Pain in both feet    Plan:  Patient was evaluated and treated and all questions answered. Consent given for treatment as described below: -No new findings. No new orders. -Mycotic toenails 1-5 bilaterally were debrided in length and girth with sterile nail nippers and dremel without incident. -Painful porokeratotic lesion(s) L hallux, R hallux, submet head 3 right foot, and submet head 5 right foot pared and enucleated with sterile scalpel blade without incident. Total number of lesions debrided=4. -Patient/POA to call should there be question/concern in the interim.  Return in about 3 months (around 01/28/2022).  Freddie Breech, DPM

## 2022-02-12 ENCOUNTER — Ambulatory Visit
Admission: RE | Admit: 2022-02-12 | Discharge: 2022-02-12 | Disposition: A | Discharge status: Home / Self Care | Payer: Medicare Other | Source: Ambulatory Visit | Attending: Family Medicine | Admitting: Family Medicine

## 2022-02-12 ENCOUNTER — Ambulatory Visit: Payer: Medicare Other

## 2022-02-12 DIAGNOSIS — M8580 Other specified disorders of bone density and structure, unspecified site: Secondary | ICD-10-CM

## 2022-02-20 ENCOUNTER — Other Ambulatory Visit: Payer: Self-pay

## 2022-02-20 ENCOUNTER — Ambulatory Visit
Admission: RE | Admit: 2022-02-20 | Discharge: 2022-02-20 | Disposition: A | Discharge status: Home / Self Care | Payer: Medicare Other | Source: Ambulatory Visit | Attending: Family Medicine | Admitting: Family Medicine

## 2022-02-20 DIAGNOSIS — Z1231 Encounter for screening mammogram for malignant neoplasm of breast: Secondary | ICD-10-CM

## 2022-03-03 ENCOUNTER — Other Ambulatory Visit: Payer: Self-pay

## 2022-03-03 ENCOUNTER — Ambulatory Visit (INDEPENDENT_AMBULATORY_CARE_PROVIDER_SITE_OTHER): Payer: Medicare Other | Admitting: Podiatry

## 2022-03-03 ENCOUNTER — Encounter: Payer: Self-pay | Admitting: Podiatry

## 2022-03-03 DIAGNOSIS — B351 Tinea unguium: Secondary | ICD-10-CM | POA: Diagnosis not present

## 2022-03-03 DIAGNOSIS — Q828 Other specified congenital malformations of skin: Secondary | ICD-10-CM | POA: Diagnosis not present

## 2022-03-03 DIAGNOSIS — I739 Peripheral vascular disease, unspecified: Secondary | ICD-10-CM | POA: Diagnosis not present

## 2022-03-03 DIAGNOSIS — M79676 Pain in unspecified toe(s): Secondary | ICD-10-CM | POA: Diagnosis not present

## 2022-03-03 DIAGNOSIS — R631 Polydipsia: Secondary | ICD-10-CM | POA: Insufficient documentation

## 2022-03-09 NOTE — Progress Notes (Signed)
?  Subjective:  ?Patient ID: Monique Benjamin, female    DOB: Apr 16, 1946,  MRN: [id number] ? ?Monique Benjamin presents to clinic today for painful porokeratotic lesion(s) bilaterally and painful mycotic toenails that limit ambulation. Painful toenails interfere with ambulation. Aggravating factors include wearing enclosed shoe gear. Pain is relieved with periodic professional debridement. Painful porokeratotic lesions are aggravated when weightbearing with and without shoegear. Pain is relieved with periodic professional debridement. ? ?Patient states feet stay cold. ? ?PCP is Maurice Small, MD , and last visit was August 20, 2021. ? ?No Known Allergies ? ?Review of Systems: Negative except as noted in the HPI. ? ?Objective: No changes noted in today's physical examination. ?[name] is a pleasant 76 y.o. African American female, WD, WN in NAD. AAO x 3.   ?Vascular CFT immediate b/l LE. Palpable DP pulses b/l LE. Faintly palpable PT pulse LLE; nonpalpable PT pulse RLE. Digital hair absent b/l. Skin temperature gradient warm to cool BLE. No pain with calf compression b/l. No edema noted b/l. No cyanosis or clubbing noted.  ?Neurologic Normal speech. Oriented to person, place, and time. Protective sensation intact 5/5 intact bilaterally with 10g monofilament b/l. Vibratory sensation intact b/l. Proprioception intact bilaterally.  ?Dermatologic Pedal integument thin, shiny and atrophic b/l. No open wounds b/l LE. No interdigital macerations noted b/l LE. Toenails 1-5 b/l elongated, discolored, dystrophic, thickened, crumbly with subungual debris and tenderness to dorsal palpation. Porokeratotic lesion(s) L hallux, R hallux, submet head 3 right foot, and submet head 5 right foot. No erythema, no edema, no drainage, no fluctuance.  ?Orthopedic: Normal muscle strength 5/5 to all lower extremity muscle groups bilaterally. Hammertoe deformity noted 2-5 b/l.  ? ?Radiographs:  None ? ?Assessment/Plan: ?1. Pain due to onychomycosis of toenail   ?2. Porokeratosis   ?3. PAD (peripheral artery disease) (HCC)   ?  ?-Toenails 1-5 b/l were debrided in length and girth with sterile nail nippers and dremel without iatrogenic bleeding.  ?-Painful porokeratotic lesion(s) bilateral great toes, submet head 3 right foot, and submet head 5 right foot pared and enucleated with sterile scalpel blade without incident. Total number of lesions debrided=4. ?-Patient/POA to call should there be question/concern in the interim.  ? ?Return in about 3 months (around 06/03/2022). ? ?Freddie Breech, DPM  ?

## 2022-05-22 ENCOUNTER — Ambulatory Visit (INDEPENDENT_AMBULATORY_CARE_PROVIDER_SITE_OTHER): Payer: Medicare Other | Admitting: Podiatry

## 2022-05-22 DIAGNOSIS — B351 Tinea unguium: Secondary | ICD-10-CM

## 2022-05-22 DIAGNOSIS — Q828 Other specified congenital malformations of skin: Secondary | ICD-10-CM | POA: Diagnosis not present

## 2022-05-22 DIAGNOSIS — M79674 Pain in right toe(s): Secondary | ICD-10-CM

## 2022-05-22 DIAGNOSIS — M79675 Pain in left toe(s): Secondary | ICD-10-CM

## 2022-05-22 DIAGNOSIS — I739 Peripheral vascular disease, unspecified: Secondary | ICD-10-CM | POA: Diagnosis not present

## 2022-05-27 NOTE — Progress Notes (Signed)
  Subjective:  Patient ID: Monique Benjamin, female    DOB: [birth year],  MRN: [id number]  Monique Benjamin presents to clinic today for painful porokeratotic lesion(s) bilaterally and painful mycotic toenails that limit ambulation. Painful toenails interfere with ambulation. Aggravating factors include wearing enclosed shoe gear. Pain is relieved with periodic professional debridement. Painful porokeratotic lesions are aggravated when weightbearing with and without shoegear. Pain is relieved with periodic professional debridement.  Patient states feet stay cold.  PCP is Kelton Pillar, MD , and last visit was August 20, 2021.  No Known Allergies  Review of Systems: Negative except as noted in the HPI.  Objective: No changes noted in today's physical examination. Constitutional KHANIA PASTERNACK is a pleasant 76 y.o. African American female, WD, WN in NAD. AAO x 3.   Vascular CFT immediate b/l LE. Palpable DP pulses b/l LE. Faintly palpable PT pulse LLE; nonpalpable PT pulse RLE. Digital hair absent b/l. Skin temperature gradient warm to cool BLE. No pain with calf compression b/l. No edema noted b/l. No cyanosis or clubbing noted.  Neurologic Normal speech. Oriented to person, place, and time. Protective sensation intact 5/5 intact bilaterally with 10g monofilament b/l. Vibratory sensation intact b/l. Proprioception intact bilaterally.  Dermatologic Pedal integument thin, shiny and atrophic b/l. No open wounds b/l LE. No interdigital macerations noted b/l LE. Toenails 1-5 b/l elongated, discolored, dystrophic, thickened, crumbly with subungual debris and tenderness to dorsal palpation. Porokeratotic lesion(s) L hallux, R hallux, submet head 3 right foot, and submet head 5 right foot. No erythema, no edema, no drainage, no fluctuance.  Orthopedic: Normal muscle strength 5/5 to all lower extremity muscle groups bilaterally. Hammertoe deformity noted 2-5 b/l.   Radiographs:  None  Assessment/Plan: 1. Pain due to onychomycosis of toenail   2. Porokeratosis   3. PAD (peripheral artery disease) (HCC)      -Toenails 1-5 b/l were debrided in length and girth with sterile nail nippers and dremel without iatrogenic bleeding.  -Painful porokeratotic lesion(s) bilateral great toes, submet head 3 right foot, and submet head 5 right foot pared and enucleated with sterile scalpel blade without incident. Total number of lesions debrided=4. -Patient/POA to call should there be question/concern in the interim.   No follow-ups on file.  Felipa Furnace, DPM

## 2022-06-04 ENCOUNTER — Emergency Department (HOSPITAL_COMMUNITY)
Admission: EM | Admit: 2022-06-04 | Discharge: 2022-06-05 | Disposition: A | Discharge status: Home / Self Care | Payer: Medicare Other | Attending: Emergency Medicine | Admitting: Emergency Medicine

## 2022-06-04 ENCOUNTER — Emergency Department (HOSPITAL_COMMUNITY): Payer: Medicare Other

## 2022-06-04 ENCOUNTER — Other Ambulatory Visit: Payer: Self-pay

## 2022-06-04 ENCOUNTER — Encounter (HOSPITAL_COMMUNITY): Payer: Self-pay

## 2022-06-04 DIAGNOSIS — R0602 Shortness of breath: Secondary | ICD-10-CM | POA: Diagnosis not present

## 2022-06-04 DIAGNOSIS — Z7982 Long term (current) use of aspirin: Secondary | ICD-10-CM | POA: Diagnosis not present

## 2022-06-04 DIAGNOSIS — N289 Disorder of kidney and ureter, unspecified: Secondary | ICD-10-CM | POA: Diagnosis not present

## 2022-06-04 DIAGNOSIS — Z79899 Other long term (current) drug therapy: Secondary | ICD-10-CM | POA: Insufficient documentation

## 2022-06-04 DIAGNOSIS — I10 Essential (primary) hypertension: Secondary | ICD-10-CM | POA: Diagnosis not present

## 2022-06-04 DIAGNOSIS — R002 Palpitations: Secondary | ICD-10-CM | POA: Insufficient documentation

## 2022-06-04 LAB — BASIC METABOLIC PANEL
Anion gap: 11 (ref 5–15)
BUN: 25 mg/dL — ABNORMAL HIGH (ref 8–23)
CO2: 27 mmol/L (ref 22–32)
Calcium: 9.4 mg/dL (ref 8.9–10.3)
Chloride: 101 mmol/L (ref 98–111)
Creatinine, Ser: 1.22 mg/dL — ABNORMAL HIGH (ref 0.44–1.00)
GFR, Estimated: 46 mL/min — ABNORMAL LOW (ref >60)
Glucose, Bld: 112 mg/dL — ABNORMAL HIGH (ref 70–99)
Potassium: 3.7 mmol/L (ref 3.5–5.1)
Sodium: 139 mmol/L (ref 135–145)

## 2022-06-04 LAB — CBC
HCT: 43.9 % (ref 36.0–46.0)
Hemoglobin: 14.5 g/dL (ref 12.0–15.0)
MCH: 30.3 pg (ref 26.0–34.0)
MCHC: 33 g/dL (ref 30.0–36.0)
MCV: 91.6 fL (ref 80.0–100.0)
Platelets: 240 10*3/uL (ref 150–400)
RBC: 4.79 MIL/uL (ref 3.87–5.11)
RDW: 11.9 % (ref 11.5–15.5)
WBC: 5.4 10*3/uL (ref 4.0–10.5)
nRBC: 0 % (ref 0.0–0.2)

## 2022-06-04 LAB — TROPONIN I (HIGH SENSITIVITY)
Troponin I (High Sensitivity): 5 ng/L (ref <18)
Troponin I (High Sensitivity): 4 ng/L (ref <18)

## 2022-06-04 NOTE — ED Triage Notes (Signed)
Pt reports heart palpitations onset today around 12pm. Denies chest pain or SOB.

## 2022-06-04 NOTE — ED Provider Triage Note (Signed)
Emergency Medicine Provider Triage Evaluation Note  Monique Benjamin , a 76 y.o. female  was evaluated in triage.  Pt complains of ch3st discomfort  Review of Systems  Positive: Negative: Shortness of breath  Physical Exam  BP (!) 144/74 (BP Location: Right Arm)   Pulse 76   Temp 98.7 F (37.1 C) (Oral)   Resp 18   Ht 5\' 5"  (1.651 m)   Wt 59.9 kg   SpO2 95%   BMI 21.97 kg/m  Gen:   Awake, no distress   Resp:  Normal effort  MSK:   Moves extremities without difficulty  Other:    Medical Decision Making  Medically screening exam initiated at 5:09 PM.  Appropriate orders placed.  was informed that the remainder of the evaluation will be completed by another provider, this initial triage assessment does not replace that evaluation, and the importance of remaining in the ED until their evaluation is complete.     [name], Elson Areas 06/04/22 1710

## 2022-06-05 NOTE — ED Notes (Signed)
Patient verbalizes understanding of discharge instructions. Opportunity for questioning and answers were provided. Armband removed by staff, pt discharged from ED. Pt taken to ED waiting room via wheel chair.  

## 2022-06-05 NOTE — ED Provider Notes (Signed)
[facility] EMERGENCY DEPARTMENT Provider Note   CSN: [id number] Arrival date & time: 06/04/22  1559     History  Chief Complaint  Patient presents with   Palpitations    Monique Benjamin is a 76 y.o. female.  The history is provided by the patient and a relative.  Palpitations Palpitations quality:  Fast Onset quality:  Sudden Duration:  30 minutes Timing:  Constant Progression:  Resolved Chronicity:  Recurrent Context: not caffeine and not illicit drugs   Worsened by:  Nothing Associated symptoms: shortness of breath   Associated symptoms: no chest pain, no diaphoresis, no lower extremity edema, no syncope and no vomiting   Risk factors: no heart disease, no hx of atrial fibrillation and no hx of PE   Patient with history of hypertension presents with palpitations.  She was at a local conference earlier in the day when she started feeling lightheaded, and her heart was racing.  This lasted approximately 30 minutes.  It resolved spontaneously, but still felt jittery afterwards.  She reports similar episode about a week ago while she was in [place].  She reports she had episode several years ago was seen by cardiology and had a negative evaluation.  No excessive caffeine use, no tobacco use is reported     Home Medications Prior to Admission medications   Medication Sig Start Date End Date Taking? Authorizing Provider  amLODipine (NORVASC) 5 MG tablet Take 5 mg by mouth daily. 09/30/18   [provider]  amLODipine (NORVASC) 5 MG tablet Take 1 tablet by mouth daily.    [provider]  aspirin 81 MG EC tablet Take by mouth.    [provider]  aspirin 81 MG EC tablet 1 tablet    [provider]  Bimatoprost (LUMIGAN OP) Apply to eye as directed. 1 drop into affected eye once every evening    [provider]  Calcium Carbonate-Vitamin D (CALCIUM-VITAMIN D) 500-200 MG-UNIT per tablet Take 1 tablet by mouth 3  (three) times daily.    [provider]  calcium-vitamin D (OSCAL WITH D) 500-200 MG-UNIT TABS tablet Take by mouth.    [provider]  cholecalciferol (VITAMIN D) 1000 UNITS tablet Take 1,000 Units by mouth daily.    [provider]  hydrochlorothiazide (HYDRODIURIL) 12.5 MG tablet Take 12.5 mg by mouth every morning. 09/09/18   [provider]  ketorolac (ACULAR) 0.5 % ophthalmic solution Place 1 drop into the right eye 3 times daily. 12/31/20   [provider]  latanoprost (XALATAN) 0.005 % ophthalmic solution INSTILL 1 DROP AT BEDTIME IN BOTH EYES 10/24/15   [provider]  levofloxacin (LEVAQUIN) 500 MG tablet Take 500 mg by mouth daily. 03/07/20   [provider]  moxifloxacin (VIGAMOX) 0.5 % ophthalmic solution  05/23/20   [provider]  omeprazole (PRILOSEC OTC) 20 MG tablet 1 tablet 09/15/18   [provider]  omeprazole (PRILOSEC) 40 MG capsule Take 40 mg by mouth 2 (two) times daily.    [provider]  prednisoLONE acetate (PRED FORTE) 1 % ophthalmic suspension Place 1 drop into the right eye 3 times daily. 12/31/20   [provider]  rosuvastatin (CRESTOR) 10 MG tablet Take 10 mg by mouth daily. 04/24/20   [provider]  rosuvastatin (CRESTOR) 5 MG tablet Take 5 mg by mouth daily. 07/27/18   [provider]  timolol (TIMOPTIC) 0.5 % ophthalmic solution PLACE 1 DROP INTO BOTH EYES IN THE  MORNING. 10/07/20   [provider]  timolol (TIMOPTIC) 0.5 % ophthalmic solution Place 1 drop into both eyes in the morning. 01/17/21   [provider]  timolol (TIMOPTIC) 0.5 % ophthalmic solution PLACE 1 DROP INTO BOTH EYES IN THE MORNING. 12/23/21   [provider]      Allergies    Patient has no known allergies.    Review of Systems   Review of Systems  Constitutional:  Negative for diaphoresis.  Respiratory:  Positive for shortness of breath.    Cardiovascular:  Positive for palpitations. Negative for chest pain and syncope.  Gastrointestinal:  Negative for vomiting.  Neurological:  Negative for syncope.    Physical Exam Updated Vital Signs BP (!) 163/83   Pulse 71   Temp 98.7 F (37.1 C) (Oral)   Resp 19   Ht 1.651 m (5\' 5" )   Wt 59.9 kg   SpO2 100%   BMI 21.97 kg/m  Physical Exam CONSTITUTIONAL: Well developed/well nourished, appears younger than stated age HEAD: Normocephalic/atraumatic EYES: EOMI/PERRL ENMT: Mucous membranes moist NECK: supple no meningeal signs SPINE/BACK:entire spine nontender CV: S1/S2 noted, no murmurs/rubs/gallops noted LUNGS: Lungs are clear to auscultation bilaterally, no apparent distress ABDOMEN: soft, nontender, no rebound or guarding, bowel sounds noted throughout abdomen GU:no cva tenderness NEURO: Pt is awake/alert/appropriate, moves all extremitiesx4.  No facial droop.  No arm or leg drift.  Patient is ambulatory EXTREMITIES: pulses normal/equal, full ROM, no lower extremity edema or tenderness SKIN: warm, color normal PSYCH: no abnormalities of mood noted, alert and oriented to situation  ED Results / Procedures / Treatments   Labs (all labs ordered are listed, but only abnormal results are displayed) Labs Reviewed  BASIC METABOLIC PANEL - Abnormal; Notable for the following components:      Result Value   Glucose, Bld 112 (*)    BUN 25 (*)    Creatinine, Ser 1.22 (*)    GFR, Estimated 46 (*)    All other components within normal limits  CBC  TROPONIN I (HIGH SENSITIVITY)  TROPONIN I (HIGH SENSITIVITY)    EKG EKG Interpretation  Date/Time:  Thursday June 04 2022 16:18:05 EDT Ventricular Rate:  81 PR Interval:  130 QRS Duration: 86 QT Interval:  360 QTC Calculation: 418 R Axis:   8 Text Interpretation: Normal sinus rhythm Nonspecific ST abnormality Abnormal ECG Interpretation limited secondary to artifact Confirmed by [id number] (Zadie Rhine) on 06/04/2022  11:13:27 PM  Radiology DG Chest 2 View  Result Date: 06/04/2022 CLINICAL DATA:  Chest fullness EXAM: CHEST - 2 VIEW COMPARISON:  None Available. FINDINGS: Normal mediastinum and cardiac silhouette. Normal pulmonary vasculature. No evidence of effusion, infiltrate, or pneumothorax. No acute bony abnormality. IMPRESSION: No acute cardiopulmonary process. Electronically Signed   By: 06/06/2022 M.D.   On: 06/04/2022 16:50    Procedures Procedures    Medications Ordered in ED Medications - No data to display  ED Course/ Medical Decision Making/ A&P Clinical Course as of 06/05/22 0004  Thu Jun 04, 2022  2311 Creatinine(!): 1.22 Renal insufficiency [DW]  Fri Jun 05, 2022  0001 Creatinine(!): 1.22 Mild renal insufficiency [DW]    Clinical Course User Index [DW] Jun 07, 2022, MD                           Medical Decision Making Amount and/or Complexity of Data Reviewed Labs: ordered. Decision-making details documented in ED Course. Radiology: ordered.   This patient  presents to the ED for concern of palpitations, this involves an extensive number of treatment options, and is a complaint that carries with it a high risk of complications and morbidity.  The differential diagnosis includes but is not limited to atrial fibrillation, SVT, sinus tachycardia, ventricular tachycardia  Comorbidities that complicate the patient evaluation: Patient's presentation is complicated by their history of hypertension  Social Determinants of Health: Patient's impaired access to primary care and her PCP is retiring   increases the complexity of managing their presentation  Additional history obtained: Additional history obtained from family Records reviewed previous admission documents  Lab Tests: I Ordered, and personally interpreted labs.  The pertinent results include: Mild renal insufficiency  Imaging Studies ordered: I ordered imaging studies including X-ray chest   I  independently visualized and interpreted imaging which showed no acute findings I agree with the radiologist interpretation  Cardiac Monitoring: The patient was maintained on a cardiac monitor.  I personally viewed and interpreted the cardiac monitor which showed an underlying rhythm of:  sinus rhythm   Test Considered: Patient with 2 troponins that are unremarkable, will defer admission or further work-up in the emergency department   Complexity of problems addressed: Patient's presentation is most consistent with  acute presentation with potential threat to life or bodily function  Disposition: After consideration of the diagnostic results and the patient's response to treatment,  I feel that the patent would benefit from discharge   .    Patient with palpitation several hours ago now back to baseline.  No syncope or severe chest pain.  Work-up in the ER is essentially unremarkable.  She has been given referral to cardiology as she would benefit from echocardiogram as well as OP  monitoring       Final Clinical Impression(s) / ED Diagnoses Final diagnoses:  Palpitations    Rx / DC Orders ED Discharge Orders          Ordered    Ambulatory referral to Cardiology       Comments: If you have not heard from the Cardiology office within the next 72 hours please call [phone].   06/05/22 0003              Zadie Rhine, MD 06/05/22 0008

## 2022-06-08 ENCOUNTER — Emergency Department (HOSPITAL_COMMUNITY)
Admission: EM | Admit: 2022-06-08 | Discharge: 2022-06-08 | Disposition: A | Discharge status: Home / Self Care | Payer: Medicare Other | Attending: Emergency Medicine | Admitting: Emergency Medicine

## 2022-06-08 ENCOUNTER — Encounter (HOSPITAL_COMMUNITY): Payer: Self-pay

## 2022-06-08 ENCOUNTER — Emergency Department (HOSPITAL_COMMUNITY): Payer: Medicare Other

## 2022-06-08 ENCOUNTER — Emergency Department (HOSPITAL_BASED_OUTPATIENT_CLINIC_OR_DEPARTMENT_OTHER)
Admit: 2022-06-08 | Discharge: 2022-06-08 | Disposition: A | Discharge status: Home / Self Care | Payer: Medicare Other | Attending: Physician Assistant | Admitting: Physician Assistant

## 2022-06-08 DIAGNOSIS — M542 Cervicalgia: Secondary | ICD-10-CM | POA: Insufficient documentation

## 2022-06-08 DIAGNOSIS — I10 Essential (primary) hypertension: Secondary | ICD-10-CM | POA: Insufficient documentation

## 2022-06-08 DIAGNOSIS — R519 Headache, unspecified: Secondary | ICD-10-CM | POA: Diagnosis not present

## 2022-06-08 DIAGNOSIS — R6884 Jaw pain: Secondary | ICD-10-CM | POA: Diagnosis not present

## 2022-06-08 DIAGNOSIS — R079 Chest pain, unspecified: Secondary | ICD-10-CM | POA: Diagnosis not present

## 2022-06-08 DIAGNOSIS — R002 Palpitations: Secondary | ICD-10-CM | POA: Diagnosis not present

## 2022-06-08 LAB — CBC
HCT: 44.5 % (ref 36.0–46.0)
Hemoglobin: 14.2 g/dL (ref 12.0–15.0)
MCH: 30.1 pg (ref 26.0–34.0)
MCHC: 31.9 g/dL (ref 30.0–36.0)
MCV: 94.5 fL (ref 80.0–100.0)
Platelets: 215 10*3/uL (ref 150–400)
RBC: 4.71 MIL/uL (ref 3.87–5.11)
RDW: 11.9 % (ref 11.5–15.5)
WBC: 5.6 10*3/uL (ref 4.0–10.5)
nRBC: 0 % (ref 0.0–0.2)

## 2022-06-08 LAB — TROPONIN I (HIGH SENSITIVITY)
Troponin I (High Sensitivity): 5 ng/L (ref <18)
Troponin I (High Sensitivity): 5 ng/L (ref <18)

## 2022-06-08 LAB — BASIC METABOLIC PANEL
Anion gap: 10 (ref 5–15)
BUN: 22 mg/dL (ref 8–23)
CO2: 25 mmol/L (ref 22–32)
Calcium: 9.5 mg/dL (ref 8.9–10.3)
Chloride: 101 mmol/L (ref 98–111)
Creatinine, Ser: 1.08 mg/dL — ABNORMAL HIGH (ref 0.44–1.00)
GFR, Estimated: 53 mL/min — ABNORMAL LOW (ref >60)
Glucose, Bld: 118 mg/dL — ABNORMAL HIGH (ref 70–99)
Potassium: 3.7 mmol/L (ref 3.5–5.1)
Sodium: 136 mmol/L (ref 135–145)

## 2022-06-08 NOTE — ED Provider Notes (Signed)
[facility] EMERGENCY DEPARTMENT Provider Note   CSN: [id number] Arrival date & time: 06/08/22  0316     History {Add pertinent medical, surgical, social history, OB history to HPI:1} Chief Complaint  Patient presents with   Chest Pain    Monique Benjamin is a 76 y.o. female.  HPI     Hx of some palpitations in the past, recently has been back to back Starting from Saturday 610 had an episode, then on 6/15 had an episode, and then again Friday, last night and this morning Called EMS brought over here Heart racing lasts 30 minutes or so, not intense the whole time, then has some headache Jaw pain and neck pain--more like a tightness /pulling  During the episodes will have some right on twitching at times  Feels like heart beating hard but not pain or tightness in chest Will have a little dyspnea with the episodes, but otherwise not  Legs swell some  No nausea, vomiting, abdominal pain Supposed to see Cardiologist on Thursday    Past Medical History:  Diagnosis Date   Anxiety    Hemorrhoids    Hyperlipidemia    Hypertension    Osteopenia     Home Medications Prior to Admission medications   Medication Sig Start Date End Date Taking? Authorizing Provider  amLODipine (NORVASC) 5 MG tablet Take 5 mg by mouth daily. 09/30/18   [provider]  amLODipine (NORVASC) 5 MG tablet Take 1 tablet by mouth daily.    [provider]  aspirin 81 MG EC tablet Take by mouth.    [provider]  aspirin 81 MG EC tablet 1 tablet    [provider]  Bimatoprost (LUMIGAN OP) Apply to eye as directed. 1 drop into affected eye once every evening    [provider]  Calcium Carbonate-Vitamin D (CALCIUM-VITAMIN D) 500-200 MG-UNIT per tablet Take 1 tablet by mouth 3 (three) times daily.    [provider]  calcium-vitamin D (OSCAL WITH D) 500-200 MG-UNIT TABS tablet Take by mouth.    [provider]   cholecalciferol (VITAMIN D) 1000 UNITS tablet Take 1,000 Units by mouth daily.    [provider]  hydrochlorothiazide (HYDRODIURIL) 12.5 MG tablet Take 12.5 mg by mouth every morning. 09/09/18   [provider]  ketorolac (ACULAR) 0.5 % ophthalmic solution Place 1 drop into the right eye 3 times daily. 12/31/20   [provider]  latanoprost (XALATAN) 0.005 % ophthalmic solution INSTILL 1 DROP AT BEDTIME IN BOTH EYES 10/24/15   [provider]  levofloxacin (LEVAQUIN) 500 MG tablet Take 500 mg by mouth daily. 03/07/20   [provider]  moxifloxacin (VIGAMOX) 0.5 % ophthalmic solution  05/23/20   [provider]  omeprazole (PRILOSEC OTC) 20 MG tablet 1 tablet 09/15/18   [provider]  omeprazole (PRILOSEC) 40 MG capsule Take 40 mg by mouth 2 (two) times daily.    [provider]  prednisoLONE acetate (PRED FORTE) 1 % ophthalmic suspension Place 1 drop into the right eye 3 times daily. 12/31/20   [provider]  rosuvastatin (CRESTOR) 10 MG tablet Take 10 mg by mouth daily. 04/24/20   [provider]  rosuvastatin (CRESTOR) 5 MG tablet Take 5 mg by mouth daily. 07/27/18   [provider]  timolol (TIMOPTIC) 0.5 % ophthalmic solution PLACE 1 DROP INTO BOTH EYES IN THE MORNING. 10/07/20   [provider]  timolol (TIMOPTIC) 0.5 % ophthalmic solution  Place 1 drop into both eyes in the morning. 01/17/21   [provider]  timolol (TIMOPTIC) 0.5 % ophthalmic solution PLACE 1 DROP INTO BOTH EYES IN THE MORNING. 12/23/21   [provider]      Allergies    Patient has no known allergies.    Review of Systems   Review of Systems  Physical Exam Updated Vital Signs BP 137/70 (BP Location: Right Arm)   Pulse 66   Temp 98.6 F (37 C) (Oral)   Resp 15   SpO2 100%  Physical Exam  ED Results / Procedures / Treatments   Labs (all labs ordered are listed, but only abnormal results are  displayed) Labs Reviewed  BASIC METABOLIC PANEL - Abnormal; Notable for the following components:      Result Value   Glucose, Bld 118 (*)    Creatinine, Ser 1.08 (*)    GFR, Estimated 53 (*)    All other components within normal limits  CBC  TROPONIN I (HIGH SENSITIVITY)  TROPONIN I (HIGH SENSITIVITY)    EKG EKG Interpretation  Date/Time:  Monday June 08 2022 03:19:41 EDT Ventricular Rate:  72 PR Interval:  142 QRS Duration: 90 QT Interval:  400 QTC Calculation: 438 R Axis:   24 Text Interpretation: Normal sinus rhythm Normal ECG When compared with ECG of 04-Jun-2022 16:18, No significant change was found Confirmed by Dione Booze ([id number]) on 06/08/2022 4:04:19 AM  Radiology DG Chest 2 View  Result Date: 06/08/2022 CLINICAL DATA:  Chest pain EXAM: CHEST - 2 VIEW COMPARISON:  06/04/2022 FINDINGS: Cardiac and mediastinal contours are within normal limits. No focal pulmonary opacity. No pleural effusion or pneumothorax. No acute osseous abnormality. IMPRESSION: No acute cardiopulmonary process. Electronically Signed   By: Wiliam Ke M.D.   On: 06/08/2022 04:05    Procedures Procedures  {Document cardiac monitor, telemetry assessment procedure when appropriate:1}  Medications Ordered in ED Medications - No data to display  ED Course/ Medical Decision Making/ A&P                           Medical Decision Making Amount and/or Complexity of Data Reviewed Labs: ordered. Radiology: ordered.   ***  {Document critical care time when appropriate:1} {Document review of labs and clinical decision tools ie heart score, Chads2Vasc2 etc:1}  {Document your independent review of radiology images, and any outside records:1} {Document your discussion with family members, caretakers, and with consultants:1} {Document social determinants of health affecting pt's care:1} {Document your decision making why or why not admission, treatments were needed:1} Final Clinical Impression(s) /  ED Diagnoses Final diagnoses:  None    Rx / DC Orders ED Discharge Orders     None

## 2022-06-08 NOTE — ED Triage Notes (Signed)
Pt comes via [facility] for palpations, seen her last week for the same.

## 2022-06-09 ENCOUNTER — Ambulatory Visit: Payer: Medicare Other | Admitting: Podiatry

## 2022-06-11 ENCOUNTER — Ambulatory Visit: Payer: Medicare Other | Admitting: Internal Medicine

## 2022-06-11 NOTE — Progress Notes (Deleted)
Cardiology Office Note:    Date:  06/11/2022   ID:  Monique Benjamin, DOB [birth year], MRN [id number]  PCP:  Maurice Small, MD   [facility] Providers Cardiologist:  None { Click to update primary MD,subspecialty MD or APP then REFRESH:1}    Referring MD: Maurice Small, MD   No chief complaint on file. Palpitations  History of Present Illness:    Monique Benjamin is a 76 y.o. female with a hx of anxiety, referral for ED visit for palpitations on 6/19. She called EMS 2/2 heart racing. No signs of ACS. Ziopatch was placed and plan was for FU. She is here prior to cessation of the study.  She denies LH, dizziness, syncope, caffeine intake, etoh, family hx of SCD. She has no known structural heart dx. 3 day Zio shows normal sinus rhythm.    Past Medical History:  Diagnosis Date   Anxiety    Hemorrhoids    Hyperlipidemia    Hypertension    Osteopenia     Past Surgical History:  Procedure Laterality Date   NO PAST SURGERIES      Current Medications: No outpatient medications have been marked as taking for the 06/11/22 encounter (Appointment) with Maisie Fus, MD.     Allergies:   Patient has no known allergies.   Social History   Socioeconomic History   Marital status: Single    Spouse name: Not on file   Number of children: 0   Years of education: college   Highest education level: Not on file  Occupational History   Occupation: Retired    Associate Professor: RETIRED  Tobacco Use   Smoking status: Never   Smokeless tobacco: Never  Substance and Sexual Activity   Alcohol use: No   Drug use: No   Sexual activity: Not on file  Other Topics Concern   Not on file  Social History Narrative   Patient lives at home alone.   Caffeine Use: none   Social Determinants of Corporate investment banker Strain: Not on file  Food Insecurity: Not on file  Transportation Needs: Not on file  Physical Activity: Not on file  Stress: Not on file  Social Connections: Not on  file     Family History: The patient's ***family history includes Breast cancer in her sister; Breast cancer (age of onset: 65) in her sister; Diabetes in her mother; Heart disease in her mother; Heart failure in her father; Hepatitis C in her sister.  ROS:   Please see the history of present illness.    *** All other systems reviewed and are negative.  EKGs/Labs/Other Studies Reviewed:    The following studies were reviewed today: ***  EKG:  EKG is *** ordered today.  The ekg ordered today demonstrates ***  Recent Labs: 06/08/2022: BUN 22; Creatinine, Ser 1.08; Hemoglobin 14.2; Platelets 215; Potassium 3.7; Sodium 136  Recent Lipid Panel No results found for: "CHOL", "TRIG", "HDL", "CHOLHDL", "VLDL", "LDLCALC", "LDLDIRECT"   Risk Assessment/Calculations:   {Does this patient have ATRIAL FIBRILLATION?:[id number]}       Physical Exam:    VS:  There were no vitals taken for this visit.    Wt Readings from Last 3 Encounters:  06/04/22 132 lb (59.9 kg)  05/13/21 130 lb (59 kg)  08/16/14 141 lb (64 kg)     GEN: *** Well nourished, well developed in no acute distress HEENT: Normal NECK: No JVD; No carotid bruits LYMPHATICS: No lymphadenopathy CARDIAC: ***RRR, no murmurs, rubs, gallops  RESPIRATORY:  Clear to auscultation without rales, wheezing or rhonchi  ABDOMEN: Soft, non-tender, non-distended MUSCULOSKELETAL:  No edema; No deformity  SKIN: Warm and dry NEUROLOGIC:  Alert and oriented x 3 PSYCHIATRIC:  Normal affect   ASSESSMENT:    Palpitaitons: She does not have high risk features including syncope c/f arrhythmia , family hx of SCD, or abnormalities on her EKGs. Zio shows normal rhythm   PLAN:    In order of problems listed above:  No further cardiac work up at this time           Medication Adjustments/Labs and Tests Ordered: Current medicines are reviewed at length with the patient today.  Concerns regarding medicines are outlined above.  No orders  of the defined types were placed in this encounter.  No orders of the defined types were placed in this encounter.   There are no Patient Instructions on file for this visit.   Signed, Maisie Fus, MD  06/11/2022 11:23 AM     [facility]

## 2022-06-29 NOTE — Addendum Note (Signed)
Encounter addended by: Andee Lineman A on: 06/29/2022 5:23 PM  Actions taken: Imaging Exam ended

## 2022-06-29 NOTE — Progress Notes (Unsigned)
Cardiology Office Note:    Date:  06/29/2022   ID:  Monique Benjamin, DOB [birth year], MRN [id number]  PCP:  Maurice Small, MD   [facility] Providers Cardiologist:  None { Click to update primary MD,subspecialty MD or APP then REFRESH:1}    Referring MD: Maurice Small, MD   No chief complaint on file. Palpitations  History of Present Illness:    Monique Benjamin is a 76 y.o. female with a hx of anxiety referral for palpitations. Went to the ED      Cardiology Studies: Ziopatch: 06/08/2022-Triggered events were for sinus, sinus tachycardia. Brief run of AT. 1x SVT event at 2am. No significant arrhythmia.  Past Medical History:  Diagnosis Date   Anxiety    Hemorrhoids    Hyperlipidemia    Hypertension    Osteopenia     Past Surgical History:  Procedure Laterality Date   NO PAST SURGERIES      Current Medications: No outpatient medications have been marked as taking for the 06/30/22 encounter (Appointment) with Maisie Fus, MD.     Allergies:   Patient has no known allergies.   Social History   Socioeconomic History   Marital status: Single    Spouse name: Not on file   Number of children: 0   Years of education: college   Highest education level: Not on file  Occupational History   Occupation: Retired    Associate Professor: RETIRED  Tobacco Use   Smoking status: Never   Smokeless tobacco: Never  Substance and Sexual Activity   Alcohol use: No   Drug use: No   Sexual activity: Not on file  Other Topics Concern   Not on file  Social History Narrative   Patient lives at home alone.   Caffeine Use: none   Social Determinants of Corporate investment banker Strain: Not on file  Food Insecurity: Not on file  Transportation Needs: Not on file  Physical Activity: Not on file  Stress: Not on file  Social Connections: Not on file     Family History: The patient's ***family history includes Breast cancer in her sister; Breast cancer (age of  onset: 70) in her sister; Diabetes in her mother; Heart disease in her mother; Heart failure in her father; Hepatitis C in her sister.  ROS:   Please see the history of present illness.    *** All other systems reviewed and are negative.  EKGs/Labs/Other Studies Reviewed:    The following studies were reviewed today: ***  EKG:  EKG is *** ordered today.  The ekg ordered today demonstrates ***  Recent Labs: 06/08/2022: BUN 22; Creatinine, Ser 1.08; Hemoglobin 14.2; Platelets 215; Potassium 3.7; Sodium 136  Recent Lipid Panel No results found for: "CHOL", "TRIG", "HDL", "CHOLHDL", "VLDL", "LDLCALC", "LDLDIRECT"   Risk Assessment/Calculations:   {Does this patient have ATRIAL FIBRILLATION?:[phone]}       Physical Exam:    VS:  There were no vitals taken for this visit.    Wt Readings from Last 3 Encounters:  06/04/22 132 lb (59.9 kg)  05/13/21 130 lb (59 kg)  08/16/14 141 lb (64 kg)     GEN: *** Well nourished, well developed in no acute distress HEENT: Normal NECK: No JVD; No carotid bruits LYMPHATICS: No lymphadenopathy CARDIAC: ***RRR, no murmurs, rubs, gallops RESPIRATORY:  Clear to auscultation without rales, wheezing or rhonchi  ABDOMEN: Soft, non-tender, non-distended MUSCULOSKELETAL:  No edema; No deformity  SKIN: Warm and dry NEUROLOGIC:  Alert  and oriented x 3 PSYCHIATRIC:  Normal affect   ASSESSMENT:    Palpitations: She does not have high risk features including syncope c/f arrhythmia , family hx of SCD, or abnormalities on her EKG. Her zio showed minimal SVT; triggered events mostly sinus. Recommend hydration, decrease caffeine intake and anxiety management   PLAN:    In order of problems listed above:  ***      {Are you ordering a CV Procedure (e.g. stress test, cath, DCCV, TEE, etc)?   Press F2        :[id number]}    Medication Adjustments/Labs and Tests Ordered: Current medicines are reviewed at length with the patient today.  Concerns  regarding medicines are outlined above.  No orders of the defined types were placed in this encounter.  No orders of the defined types were placed in this encounter.   There are no Patient Instructions on file for this visit.   Signed, Maisie Fus, MD  06/29/2022 10:50 AM    [facility]

## 2022-06-30 ENCOUNTER — Ambulatory Visit (INDEPENDENT_AMBULATORY_CARE_PROVIDER_SITE_OTHER): Payer: Medicare Other | Admitting: Internal Medicine

## 2022-06-30 ENCOUNTER — Encounter: Payer: Self-pay | Admitting: Internal Medicine

## 2022-06-30 VITALS — BP 118/70 | HR 59 | Ht 65.0 in | Wt 135.2 lb

## 2022-06-30 DIAGNOSIS — R002 Palpitations: Secondary | ICD-10-CM | POA: Diagnosis not present

## 2022-06-30 MED ORDER — DILTIAZEM HCL 30 MG PO TABS: 30.0000 mg | ORAL_TABLET | Freq: Four times a day (QID) | ORAL | 5 refills | Status: DC | PRN

## 2022-06-30 NOTE — Patient Instructions (Signed)
Medication Instructions:  START: DILTIAZEM 30mg  EVERY 6 HOURS AS NEEDED FOR PALPITATIONS *If you need a refill on your cardiac medications before your next appointment, please call your pharmacy*  Lab Work: None Ordered At This Time.  If you have labs (blood work) drawn today and your tests are completely normal, you will receive your results only by: MyChart Message (if you have MyChart) OR A paper copy in the mail If you have any lab test that is abnormal or we need to change your treatment, we will call you to review the results.  Testing/Procedures: None Ordered At This Time.   Follow-Up: At [facility], you and your health needs are our priority.  As part of our continuing mission to provide you with exceptional heart care, we have created designated Provider Care Teams.  These Care Teams include your primary Cardiologist (physician) and Advanced Practice Providers (APPs -  Physician Assistants and Nurse Practitioners) who all work together to provide you with the care you need, when you need it.  We recommend signing up for the patient portal called "MyChart".  Sign up information is provided on this After Visit Summary.  MyChart is used to connect with patients for Virtual Visits (Telemedicine).  Patients are able to view lab/test results, encounter notes, upcoming appointments, etc.  Non-urgent messages can be sent to your provider as well.   To learn more about what you can do with MyChart, go to [facility].    Your next appointment:   6 month(s)  The format for your next appointment:   In Person  Provider:   [email or web address], MD

## 2022-07-07 ENCOUNTER — Ambulatory Visit: Payer: Medicare Other | Admitting: Podiatry

## 2022-09-08 ENCOUNTER — Ambulatory Visit (INDEPENDENT_AMBULATORY_CARE_PROVIDER_SITE_OTHER): Payer: Medicare Other | Admitting: Podiatry

## 2022-09-08 ENCOUNTER — Encounter: Payer: Self-pay | Admitting: Podiatry

## 2022-09-08 DIAGNOSIS — I739 Peripheral vascular disease, unspecified: Secondary | ICD-10-CM

## 2022-09-08 DIAGNOSIS — M79676 Pain in unspecified toe(s): Secondary | ICD-10-CM | POA: Diagnosis not present

## 2022-09-08 DIAGNOSIS — Z8679 Personal history of other diseases of the circulatory system: Secondary | ICD-10-CM | POA: Diagnosis not present

## 2022-09-08 DIAGNOSIS — B351 Tinea unguium: Secondary | ICD-10-CM

## 2022-09-08 DIAGNOSIS — Q828 Other specified congenital malformations of skin: Secondary | ICD-10-CM | POA: Diagnosis not present

## 2022-09-13 NOTE — Progress Notes (Signed)
  Subjective:  Patient ID: Monique Benjamin, female    DOB: 24-Apr-1946,  MRN: [id number]  Monique Benjamin presents to clinic today for at risk foot care. Patient has h/o PAD (intermittent claudication) and painful porokeratotic lesion(s) b/l lower extremities and painful mycotic toenails that limit ambulation. Painful toenails interfere with ambulation. Aggravating factors include wearing enclosed shoe gear. Pain is relieved with periodic professional debridement. Painful porokeratotic lesions are aggravated when weightbearing with and without shoegear. Pain is relieved with periodic professional debridement.  New problem(s): None.   PCP is Kelton Pillar, MD , and last visit was  March, 2023.  No Known Allergies  Review of Systems: Negative except as noted in the HPI.  Objective: No changes noted in today's physical examination.  Monique Benjamin is a pleasant 76 y.o. female in NAD. AAO x 3. Vascular CFT immediate b/l LE. Palpable DP pulses b/l LE. Faintly palpable PT pulse LLE; nonpalpable PT pulse RLE. Digital hair absent b/l. Skin temperature gradient warm to cool BLE. No pain with calf compression b/l. No edema noted b/l. No cyanosis or clubbing noted.  Neurologic Normal speech. Oriented to person, place, and time. Protective sensation intact 5/5 intact bilaterally with 10g monofilament b/l. Vibratory sensation intact b/l. Proprioception intact bilaterally.  Dermatologic Pedal integument thin, shiny and atrophic b/l. No open wounds b/l LE. No interdigital macerations noted b/l LE. Toenails 1-5 b/l elongated, discolored, dystrophic, thickened, crumbly with subungual debris and tenderness to dorsal palpation. Porokeratotic lesion(s) L hallux, R hallux, submet head 3 right foot, and submet head 5 right foot. No erythema, no edema, no drainage, no fluctuance.  Orthopedic: Normal muscle strength 5/5 to all lower extremity muscle groups bilaterally. Hammertoe deformity noted 2-5 b/l.    Radiographs: None  Assessment/Plan: 1. Pain due to onychomycosis of toenail   2. Porokeratosis   3. PAD (peripheral artery disease) ([place])     -Examined patient. -No new findings. No new orders. -Patient to continue soft, supportive shoe gear daily. -Mycotic toenails 1-5 bilaterally were debrided in length and girth with sterile nail nippers and dremel without incident. -Porokeratotic lesion(s) bilateral great toes, submet head 3 right foot, and submet head 5 right foot pared and enucleated with sterile currette without incident. Total number of lesions debrided=4. -Patient/POA to call should there be question/concern in the interim.   Return in about 3 months (around 12/08/2022).  Marzetta Board, DPM

## 2022-12-25 ENCOUNTER — Ambulatory Visit: Payer: Medicare Other | Admitting: Podiatry

## 2023-01-12 ENCOUNTER — Ambulatory Visit (INDEPENDENT_AMBULATORY_CARE_PROVIDER_SITE_OTHER): Payer: Medicare Other | Admitting: Podiatry

## 2023-01-12 ENCOUNTER — Ambulatory Visit: Payer: Medicare Other | Admitting: Podiatry

## 2023-01-12 VITALS — BP 163/75

## 2023-01-12 DIAGNOSIS — Q828 Other specified congenital malformations of skin: Secondary | ICD-10-CM | POA: Diagnosis not present

## 2023-01-12 DIAGNOSIS — T148XXA Other injury of unspecified body region, initial encounter: Secondary | ICD-10-CM | POA: Diagnosis not present

## 2023-01-12 DIAGNOSIS — I739 Peripheral vascular disease, unspecified: Secondary | ICD-10-CM | POA: Diagnosis not present

## 2023-01-12 DIAGNOSIS — B351 Tinea unguium: Secondary | ICD-10-CM | POA: Diagnosis not present

## 2023-01-12 DIAGNOSIS — M79676 Pain in unspecified toe(s): Secondary | ICD-10-CM | POA: Diagnosis not present

## 2023-01-12 NOTE — Progress Notes (Signed)
  Subjective:  Patient ID: Monique Benjamin, female    DOB: 1946/04/14,  MRN: [id number]  Monique Benjamin presents to clinic today for at risk foot care. Patient has h/o PAD and painful porokeratotic lesion(s) both feet and painful mycotic toenails that limit ambulation. Painful toenails interfere with ambulation. Aggravating factors include wearing enclosed shoe gear. Pain is relieved with periodic professional debridement. Painful porokeratotic lesions are aggravated when weightbearing with and without shoegear. Pain is relieved with periodic professional debridement.  Chief Complaint  Patient presents with   Nail Problem    [facility] PCP-Pahwani,Rinka PCP [facility]-Has not met yet      New problem(s):  New development of blood blister right great toe which occurred from wearing a new pair of shoes.  She has discontinued wearing the shoes. Blister remains closed. She has resumed wearing her Birkenstocks.  PCP is Pahwani, Rinka R, MD.  No Known Allergies  Review of Systems: Negative except as noted in the HPI.  Objective:  Vitals:   01/12/23 1501  BP: (!) 163/75   Monique Benjamin is a pleasant 77 y.o. female WD, WN in NAD. AAO x 3.  Vascular CFT immediate b/l LE. Palpable DP pulses b/l LE. Faintly palpable PT pulse LLE; nonpalpable PT pulse RLE. Wearing open toed compression hose b/l. Digital hair absent b/l. Skin temperature gradient warm to cool BLE. No pain with calf compression b/l. No edema noted b/l. No cyanosis or clubbing noted.  Neurologic Normal speech. Oriented to person, place, and time. Protective sensation intact 5/5 intact bilaterally with 10g monofilament b/l. Vibratory sensation intact b/l. Proprioception intact bilaterally.  Dermatologic Pedal integument thin, shiny and atrophic b/l. No open wounds b/l LE. No interdigital macerations noted b/l LE.   Blood blister medial IPJ of right great toe remains closed with dark heme. Measures 2.0 x 1.5 cm and appears to be resolving  as underlying fluid appears to be coagulating.  Toenails 1-5 b/l elongated, discolored, dystrophic, thickened, crumbly with subungual debris and tenderness to dorsal palpation.   Porokeratotic lesion(s) L hallux, R hallux, submet head 3 right foot, and submet head 5 right foot. No erythema, no edema, no drainage, no fluctuance.  Orthopedic: Normal muscle strength 5/5 to all lower extremity muscle groups bilaterally. Hammertoe deformity noted 2-5 b/l.   Radiographs: None  Assessment/Plan: 1. Pain due to onychomycosis of toenail   2. Porokeratosis   3. Blood blister   4. PAD (peripheral artery disease) ([place])     -Patient was evaluated and treated. All patient's and/or POA's questions/concerns answered on today's visit. -New development of blood blister right great toe which is resolving. She has discontinued wearing ill fitting shoe gear. Dressing applied to digit. She may purchase blister bandages daily for protection. Do not open blister. She may follow up with Dr. Sherryle Lis in one week. -Continue supportive shoe gear daily. -Toenails 1-5 b/l were debrided in length and girth with sterile nail nippers and dremel without iatrogenic bleeding.  -Porokeratotic lesion(s) left great toe, right great toe, submet head 3 right foot, and submet head 5 right foot pared and enucleated with sterile currette without incident. Total number of lesions debrided=4. -Patient/POA to call should there be question/concern in the interim.   Return in about 1 week (around 01/19/2023).  Marzetta Board, DPM

## 2023-01-16 ENCOUNTER — Encounter: Payer: Self-pay | Admitting: Podiatry

## 2023-01-19 ENCOUNTER — Encounter: Payer: Self-pay | Admitting: Podiatry

## 2023-01-19 ENCOUNTER — Ambulatory Visit (INDEPENDENT_AMBULATORY_CARE_PROVIDER_SITE_OTHER): Payer: Medicare Other | Admitting: Podiatry

## 2023-01-19 DIAGNOSIS — T148XXA Other injury of unspecified body region, initial encounter: Secondary | ICD-10-CM | POA: Diagnosis not present

## 2023-01-19 DIAGNOSIS — I739 Peripheral vascular disease, unspecified: Secondary | ICD-10-CM

## 2023-01-19 NOTE — Patient Instructions (Signed)
Call [phone] to schedule your vascular testing:  [facility] [address], [place], [place] [address]  

## 2023-01-19 NOTE — Progress Notes (Signed)
  Subjective:  Patient ID: Monique Benjamin, female    DOB: June 02, 1946,  MRN: [id number]  Chief Complaint  Patient presents with   Blister    1week follow up for blood blister right great toe    77 y.o. female presents with the above complaint. History confirmed with patient.   Objective:  Physical Exam: no trophic changes or ulcerative lesions, normal sensory exam, and blood blister present right hallux, nonpalpable DP and PT pulse, diminished hair growth  Assessment:   1. PAD (peripheral artery disease) ([place])   2. Blood blister      Plan:  Patient was evaluated and treated and all questions answered.  No evidence of infection of this blood blister I hope expect this will heal uneventfully.  I did recommend noninvasive vascular testing as I do think she has some component of PAD.  I will see her back in 1 month for reevaluation I think she can leave this open to air and apply Neosporin or Vaseline daily  Return in 1 month (on 02/18/2023) for follow up on blood blister and after vascular testing.

## 2023-01-21 ENCOUNTER — Ambulatory Visit: Payer: Medicare Other | Admitting: Podiatry

## 2023-02-11 ENCOUNTER — Ambulatory Visit (HOSPITAL_COMMUNITY)
Admission: RE | Admit: 2023-02-11 | Discharge: 2023-02-11 | Disposition: A | Discharge status: Home / Self Care | Payer: Medicare Other | Source: Ambulatory Visit | Attending: Podiatry | Admitting: Podiatry

## 2023-02-11 DIAGNOSIS — I739 Peripheral vascular disease, unspecified: Secondary | ICD-10-CM | POA: Insufficient documentation

## 2023-02-11 DIAGNOSIS — T148XXA Other injury of unspecified body region, initial encounter: Secondary | ICD-10-CM | POA: Insufficient documentation

## 2023-02-11 LAB — VAS US ABI WITH/WO TBI
Left ABI: 1.03
Right ABI: 0.69

## 2023-02-16 ENCOUNTER — Ambulatory Visit: Payer: Medicare Other | Admitting: Podiatry

## 2023-03-03 ENCOUNTER — Ambulatory Visit: Payer: Medicare Other | Admitting: Podiatry

## 2023-03-03 ENCOUNTER — Ambulatory Visit (INDEPENDENT_AMBULATORY_CARE_PROVIDER_SITE_OTHER): Payer: Medicare Other | Admitting: Podiatry

## 2023-03-03 DIAGNOSIS — T148XXA Other injury of unspecified body region, initial encounter: Secondary | ICD-10-CM | POA: Diagnosis not present

## 2023-03-03 DIAGNOSIS — I739 Peripheral vascular disease, unspecified: Secondary | ICD-10-CM

## 2023-03-04 NOTE — Progress Notes (Signed)
Subjective:  Patient ID: Monique Benjamin, female    DOB: 1946-06-22,  MRN: [id number]  Chief Complaint  Patient presents with   Blister    1 month follow up on blood blister and after vascular testing    77 y.o. female presents with the above complaint. History confirmed with patient.  She says is doing better, has had no drainage.  She completed the vascular testing  Objective:  Physical Exam: no trophic changes or ulcerative lesions, normal sensory exam, and blood blister right hallux has healed with good epithelization deep to this, no open ulceration currently, nonpalpable DP and PT pulse, diminished hair growth  ABI Findings:  +---------+------------------+-----+----------+--------------+  Right   Rt Pressure (mmHg)IndexWaveform  Comment         +---------+------------------+-----+----------+--------------+  Brachial 128                                              +---------+------------------+-----+----------+--------------+  PTA                            monophasicbarely audible  +---------+------------------+-----+----------+--------------+  DP      90                0.69 monophasic                +---------+------------------+-----+----------+--------------+  Great Toe39                0.30                           +---------+------------------+-----+----------+--------------+   +---------+------------------+-----+----------+-------+  Left    Lt Pressure (mmHg)IndexWaveform  Comment  +---------+------------------+-----+----------+-------+  Brachial 131                                       +---------+------------------+-----+----------+-------+  PTA     118               0.90 monophasic         +---------+------------------+-----+----------+-------+  DP      135               1.03 monophasic         +---------+------------------+-----+----------+-------+  Great Toe46                0.35                     +---------+------------------+-----+----------+-------+   +-------+-----------+-----------+------------+------------+  ABI/TBIToday's ABIToday's TBIPrevious ABIPrevious TBI  +-------+-----------+-----------+------------+------------+  Right 0.69       0.3        0.75        0.24          +-------+-----------+-----------+------------+------------+  Left  1.03       0.35       1.02        0.46          +-------+-----------+-----------+------------+------------+        Previous ABI on 11/09/17.    Summary:  Right: Resting right ankle-brachial index indicates moderate right lower  extremity arterial disease. The right toe-brachial index is abnormal.   Left: Resting left ankle-brachial index is within normal range. The left  toe-brachial index is abnormal.  Although ankle brachial indices are within normal limits (0.95-1.29),  arterial Doppler waveforms at the ankle suggest some component of arterial  occlusive disease.   *See table(s) above for measurements and observations.   Assessment:   1. PAD (peripheral artery disease) ([place])   2. Blood blister      Plan:  Patient was evaluated and treated and all questions answered.  Thankfully has healed uneventfully and there are no signs of residual ulceration or infection.  We discussed the results of her ultrasound and ABI and she does have evidence of PAD.  Given her adequate wound healing currently and lack of claudication or lifestyle limiting factors I recommended monitoring this.  We reviewed that regular exercise can mitigate this by promoting collateralization and she will work on this.  I discussed the risk of recurrence.  If she ever has further evidence of nonhealing of then we will recommend vascular intervention.  She will follow with me as needed.  Return if symptoms worsen or fail to improve.

## 2023-03-16 ENCOUNTER — Encounter: Payer: Self-pay | Admitting: Internal Medicine

## 2023-03-16 ENCOUNTER — Ambulatory Visit: Payer: Medicare Other | Attending: Internal Medicine | Admitting: Internal Medicine

## 2023-03-16 VITALS — BP 136/72 | HR 65 | Ht 65.0 in | Wt 134.8 lb

## 2023-03-16 DIAGNOSIS — I739 Peripheral vascular disease, unspecified: Secondary | ICD-10-CM

## 2023-03-16 NOTE — Progress Notes (Signed)
Cardiology Office Note:    Date:  03/16/2023   ID:  Monique Benjamin, DOB 11-03-1946, MRN [id number]  PCP:  Mckinley Jewel, MD   [place] [facility] Cardiologist:  Janina Mayo, MD     Referring MD: Mckinley Jewel, MD   No chief complaint on file. Palpitations  History of Present Illness:    Monique Benjamin is a 77 y.o. female with a hx of anxiety referral for palpitations from the ED  She has not had palpitations in the last week. Prior to this in late June she had a 3-4 days of palpitations.She drinks minimal caffeine. She does not have high risk features including syncope c/f arrhythmia , family hx of SCD. She denies LH or dizziness. She felt a headache.  No SOB. She had a ziopatch that did not show afib. She had minimal AT/SVT.  Her blood pressure is well controlled today. She has no cardiac disease hx  Interim Hx 03/16/2023 No hospitalizations. She had used the PRN cardizem once since her last visit. No syncope.    Cardiology Studies: Ziopatch: 06/08/2022 2 week-Triggered events were for sinus, sinus tachycardia. Brief run of AT. 1x SVT event at 2am. No significant arrhythmia.  Past Medical History:  Diagnosis Date   Anxiety    Hemorrhoids    Hyperlipidemia    Hypertension    Osteopenia     Past Surgical History:  Procedure Laterality Date   NO PAST SURGERIES      Current Medications: Current Meds  Medication Sig   Calcium Carbonate-Vitamin D (CALCIUM-VITAMIN D) 500-200 MG-UNIT per tablet Take 1 tablet by mouth 3 (three) times daily.   diltiazem (CARDIZEM) 30 MG tablet Take 1 tablet (30 mg total) by mouth every 6 (six) hours as needed (for palpitations).   hydrochlorothiazide (HYDRODIURIL) 12.5 MG tablet Take 12.5 mg by mouth every morning.   omeprazole (PRILOSEC OTC) 20 MG tablet 1 tablet   rosuvastatin (CRESTOR) 10 MG tablet Take 10 mg by mouth daily.   rosuvastatin (CRESTOR) 5 MG tablet Take 5 mg by mouth daily.   timolol (TIMOPTIC) 0.5 %  ophthalmic solution PLACE 1 DROP INTO BOTH EYES IN THE MORNING.     Allergies:   Patient has no known allergies.   Social History   Socioeconomic History   Marital status: Single    Spouse name: Not on file   Number of children: 0   Years of education: college   Highest education level: Not on file  Occupational History   Occupation: Retired    Fish farm manager: RETIRED  Tobacco Use   Smoking status: Never   Smokeless tobacco: Never  Substance and Sexual Activity   Alcohol use: No   Drug use: No   Sexual activity: Not on file  Other Topics Concern   Not on file  Social History Narrative   Patient lives at home alone.   Caffeine Use: none   Social Determinants of Radio broadcast assistant Strain: Not on file  Food Insecurity: Not on file  Transportation Needs: Not on file  Physical Activity: Not on file  Stress: Not on file  Social Connections: Not on file     Family History: The patient's family history includes Breast cancer in her sister; Breast cancer (age of onset: 38) in her sister; Diabetes in her mother; Heart disease in her mother; Heart failure in her father; Hepatitis C in her sister.  ROS:   Please see the history of present illness.  All other systems reviewed and are negative.  EKGs/Labs/Other Studies Reviewed:    The following studies were reviewed today:   EKG:  EKG is  ordered today.  The ekg ordered today demonstrates   06/30/2022- sinus bradycardia HR 59 bpm  03/16/2023- NSR, LVH  Recent Labs: 06/08/2022: BUN 22; Creatinine, Ser 1.08; Hemoglobin 14.2; Platelets 215; Potassium 3.7; Sodium 136  Recent Lipid Panel No results found for: "CHOL", "TRIG", "HDL", "CHOLHDL", "VLDL", "LDLCALC", "LDLDIRECT"   Risk Assessment/Calculations:           Physical Exam:    VS:    Vitals:   03/16/23 1457  BP: 136/72  Pulse: 65     Wt Readings from Last 3 Encounters:  03/16/23 134 lb 12.8 oz (61.1 kg)  06/30/22 135 lb 3.2 oz (61.3 kg)   06/04/22 132 lb (59.9 kg)     GEN:  Well nourished, well developed in no acute distress HEENT: Normal NECK: No JVD; No carotid bruits LYMPHATICS: No lymphadenopathy CARDIAC: RRR, no murmurs, rubs, gallops RESPIRATORY:  Clear to auscultation without rales, wheezing or rhonchi  ABDOMEN: Soft, non-tender, non-distended MUSCULOSKELETAL:  No edema; No deformity  SKIN: Warm and dry NEUROLOGIC:  Alert and oriented x 3 PSYCHIATRIC:  Normal affect   ASSESSMENT:    Palpitations:  Her zio showed minimal SVT; triggered events mostly sinus. She wanted to avoid medications daily. The symptoms do bother her. Will continue dilt PRN. Also recommend hydration with electrolytes. Otherwise, she is doing well, no issues. PLAN:    In order of problems listed above:   Follow up 12 months      Medication Adjustments/Labs and Tests Ordered: Current medicines are reviewed at length with the patient today.  Concerns regarding medicines are outlined above.    Signed, Janina Mayo, MD  03/16/2023 4:28 PM    [place]

## 2023-03-16 NOTE — Patient Instructions (Signed)
Medication Instructions:  Your physician recommends that you continue on your current medications as directed. Please refer to the Current Medication list given to you today.   *If you need a refill on your cardiac medications before your next appointment, please call your pharmacy*   Lab Work: None ordered    Testing/Procedures: None ordered   Follow-Up: At [facility], you and your health needs are our priority.  As part of our continuing mission to provide you with exceptional heart care, we have created designated Provider Care Teams.  These Care Teams include your primary Cardiologist (physician) and Advanced Practice Providers (APPs -  Physician Assistants and Nurse Practitioners) who all work together to provide you with the care you need, when you need it.  We recommend signing up for the patient portal called "MyChart".  Sign up information is provided on this After Visit Summary.  MyChart is used to connect with patients for Virtual Visits (Telemedicine).  Patients are able to view lab/test results, encounter notes, upcoming appointments, etc.  Non-urgent messages can be sent to your provider as well.   To learn more about what you can do with MyChart, go to [email or web address].    Your next appointment:   1 year(s)  Provider:   Janina Mayo, MD

## 2023-05-04 ENCOUNTER — Ambulatory Visit (INDEPENDENT_AMBULATORY_CARE_PROVIDER_SITE_OTHER): Payer: Medicare Other | Admitting: Podiatry

## 2023-05-04 DIAGNOSIS — B351 Tinea unguium: Secondary | ICD-10-CM

## 2023-05-04 DIAGNOSIS — L84 Corns and callosities: Secondary | ICD-10-CM | POA: Diagnosis not present

## 2023-05-04 DIAGNOSIS — I739 Peripheral vascular disease, unspecified: Secondary | ICD-10-CM

## 2023-05-04 NOTE — Progress Notes (Signed)
       Subjective:  Patient ID: Monique Benjamin, female    DOB: Jan 07, 1946,  MRN: [id number]  Monique Benjamin presents to clinic today for:  Chief Complaint  Patient presents with   Nail Problem    RFC bilateral nail trim  . Patient notes nails are thick and elongated, causing pain in shoe gear when ambulating.  She has painful calluses on right foot  PCP is Pahwani, Kasandra Knudsen, MD.  Last seen 03/16/23.  No Known Allergies  Review of Systems: Negative except as noted in the HPI.  Objective:  There were no vitals filed for this visit.  Monique Benjamin is a pleasant 77 y.o. female in NAD. AAO x 3.  Vascular Examination: Patient has palpable DP pulse, absent PT pulse bilateral.  Delayed capillary refill bilateral toes.  Sparse digital hair bilateral.  Proximal to distal cooling WNL bilateral.    Dermatological Examination: Interspaces are clear with no open lesions noted bilateral.  Nails are 3-26mm thick, with yellowish/brown discoloration, subungual debris and distal onycholysis x10.  There is pain with compression of nails x10.  There are hyperkeratotic lesions noted on the plantar aspect of the right hallux, submet 3, submet 5, and 1 focal lesion on the plantar aspect of the right heel..  Neurological Examination: Protective sensation intact b/l LE. Vibratory sensation intact b/l LE.  Musculoskeletal Examination: Muscle strength 5/5 to all LE muscle groups b/l.   Patient qualifies for at-risk foot care because of PVD, pain and nails.  Assessment/Plan: 1. PVD (peripheral vascular disease) (HCC)   2. Dermatophytosis of nail   3. Corns    Mycotic nails x10 were sharply debrided with sterile nail nippers and power debriding burr to decrease bulk and length.  Hyperkeratotic lesions were shaved with #312 blade.  Return in about 3 months (around 08/04/2023) for RFC.   Clerance Lav, DPM, FACFAS [facility]     [address] [place], [place] [address]                Office [phone]  Fax [phone]

## 2023-06-14 ENCOUNTER — Ambulatory Visit (INDEPENDENT_AMBULATORY_CARE_PROVIDER_SITE_OTHER): Payer: Medicare Other

## 2023-06-14 ENCOUNTER — Ambulatory Visit (INDEPENDENT_AMBULATORY_CARE_PROVIDER_SITE_OTHER): Payer: Medicare Other | Admitting: Podiatry

## 2023-06-14 ENCOUNTER — Ambulatory Visit: Payer: Medicare Other | Admitting: Podiatry

## 2023-06-14 VITALS — BP 143/70 | HR 72

## 2023-06-14 DIAGNOSIS — R600 Localized edema: Secondary | ICD-10-CM

## 2023-06-14 DIAGNOSIS — M79671 Pain in right foot: Secondary | ICD-10-CM | POA: Diagnosis not present

## 2023-06-14 DIAGNOSIS — M79672 Pain in left foot: Secondary | ICD-10-CM | POA: Diagnosis not present

## 2023-06-14 DIAGNOSIS — M25572 Pain in left ankle and joints of left foot: Secondary | ICD-10-CM

## 2023-06-14 DIAGNOSIS — M25671 Stiffness of right ankle, not elsewhere classified: Secondary | ICD-10-CM

## 2023-06-14 DIAGNOSIS — M25571 Pain in right ankle and joints of right foot: Secondary | ICD-10-CM | POA: Diagnosis not present

## 2023-06-14 MED ORDER — MELOXICAM 7.5 MG PO TABS: 7.5000 mg | ORAL_TABLET | Freq: Every day | ORAL | 0 refills | Status: DC

## 2023-06-14 NOTE — Progress Notes (Signed)
HPI: 77 y.o. female presenting today with concern of bilateral ankle pain.  Is worse than the left.  She notices some swelling around the right ankle also.  Denies bruising.  Denies injury.  She was also seen recently for routine footcare.  States that her callus near the fourth toe on the plantar aspect of the foot is painful.  She would not be covered to have this shaved today.  Past Medical History:  Diagnosis Date   Anxiety    Hemorrhoids    Hyperlipidemia    Hypertension    Osteopenia     Past Surgical History:  Procedure Laterality Date   NO PAST SURGERIES      No Known Allergies   Physical Exam:  General: The patient is alert and oriented x3 in no acute distress.  Dermatology: Skin is warm, dry and supple bilateral lower extremities. Interspaces are clear of maceration and debris.  There is a honeycomb shaped hyperkeratotic lesion with black pinpoint dots submet 4 right foot.  This is consistent with verruca.  Vascular: Palpable pedal pulses bilaterally. Capillary refill within normal limits.  No appreciable edema.  No erythema or calor.  Neurological: Light touch sensation grossly intact bilateral feet.   Musculoskeletal Exam: Pain on palpation medial anterior and lateral aspect of the right ankle with localized edema/effusion of the right ankle.  Minimal pain on palpation of the left ankle.  No edema is appreciated here.  Right ankle is stiff with attempted range of motion and painful with attempted range of motion.  There is no crepitus on range of motion.  Left ankle range of motion is within normal meds.  Radiographic Exam (bilateral foot views, 3 weightbearing views, 06/14/2023):  Decreased osseous mineralization.  Joint space narrowing at the first metatarsophalangeal joint bilateral.  Minimal joint space narrowing at the ankle mortise bilateral no fracture seen.  Assessment/Plan of Care: 1. Arthralgia of both ankles   2. Pain in both feet   3. Localized edema    4. Ankle stiffness, right     Meds ordered this encounter  Medications   meloxicam (MOBIC) 7.5 MG tablet    Sig: Take 1 tablet (7.5 mg total) by mouth daily.    Dispense:  20 tablet    Refill:  0   DG FOOT COMPLETE RIGHT DG FOOT COMPLETE LEFT  Discussed clinical and radiographic findings with patient today.  Discussed treatment options which included oral anti-inflammatories, corticosteroid injection, physical therapy, for ankle support/elastic ankle sleeve.  Patient opted to proceed with oral anti-inflammatories.  Will start her on the 2-week course.  If this is effective in alleviating her discomfort she may call for refills.  Also advised patient to rest ice and elevate the area.  She already wears compression stocking and will continue to do so.  Informed patient that that her next visit for routine footcare will shaved to the right submet 4 hyperkeratotic lesion and apply Cantharone Plus solution to the area to see if this gives her more relief for a longer period of time  Julie Nay DBurna Mortimer, DPM, FACFAS [facility]     [address], [place] [address]                Office [phone]  Fax [phone]

## 2023-06-15 ENCOUNTER — Ambulatory Visit: Payer: Medicare Other | Admitting: Podiatry

## 2023-08-10 ENCOUNTER — Encounter: Payer: Self-pay | Admitting: Podiatry

## 2023-08-10 ENCOUNTER — Ambulatory Visit (INDEPENDENT_AMBULATORY_CARE_PROVIDER_SITE_OTHER): Payer: Medicare Other | Admitting: Podiatry

## 2023-08-10 DIAGNOSIS — I739 Peripheral vascular disease, unspecified: Secondary | ICD-10-CM | POA: Diagnosis not present

## 2023-08-10 DIAGNOSIS — B351 Tinea unguium: Secondary | ICD-10-CM | POA: Diagnosis not present

## 2023-08-10 DIAGNOSIS — L84 Corns and callosities: Secondary | ICD-10-CM | POA: Diagnosis not present

## 2023-08-10 NOTE — Progress Notes (Unsigned)
       Subjective:  Patient ID: Monique Benjamin, female    DOB: 02-03-1946,  MRN: [id number]  Monique Benjamin presents to clinic today for:  Chief Complaint  Patient presents with   Diabetes    RM20: Patient is here for a follow up for chronic conditions for [facility]   Patient notes nails are thick and elongated, causing pain in shoe gear when ambulating.  She also has painful calluses  PCP is Pahwani, Kasandra Knudsen, MD. date last seen was approximately April 02, 2023  Past Medical History:  Diagnosis Date   Anxiety    Hemorrhoids    Hyperlipidemia    Hypertension    Osteopenia     No Known Allergies  Review of Systems: Negative except as noted in the HPI.  Objective:  There were no vitals filed for this visit.  Monique Benjamin is a pleasant 77 y.o. female in NAD. AAO x 3.  Vascular Examination: Patient has palpable DP pulse, absent PT pulse bilateral.  Delayed capillary refill bilateral toes.  Sparse digital hair bilateral.  Proximal to distal cooling WNL bilateral.    Dermatological Examination: Interspaces are clear with no open lesions noted bilateral.  Skin is shiny and atrophic bilateral.  Nails are 3-68mm thick, with yellowish/brown discoloration, subungual debris and distal onycholysis x10.  There is pain with compression of nails x10.  There are hyperkeratotic lesions noted right submet 3 and 5, right heel, left plantar hallux.  Patient qualifies for at-risk foot care because of PVD .  Assessment/Plan: 1. Onychomycosis   2. PVD (peripheral vascular disease) (HCC)   3. Callus    Mycotic nails x10 were sharply debrided with sterile nail nippers and power debriding burr to decrease bulk and length.  Hyperkeratotic lesions x 4 were shaved with #312 blade.  Return in about 3 months (around 11/10/2023) for RFC.   Clerance Lav, DPM, FACFAS [facility]     [address] [place], [place] [address]                 Office [phone]  Fax [phone]

## 2023-08-12 DIAGNOSIS — I10 Essential (primary) hypertension: Secondary | ICD-10-CM | POA: Insufficient documentation

## 2023-11-23 ENCOUNTER — Ambulatory Visit (INDEPENDENT_AMBULATORY_CARE_PROVIDER_SITE_OTHER): Payer: Medicare Other | Admitting: Podiatry

## 2023-11-23 DIAGNOSIS — M79675 Pain in left toe(s): Secondary | ICD-10-CM | POA: Diagnosis not present

## 2023-11-23 DIAGNOSIS — M79674 Pain in right toe(s): Secondary | ICD-10-CM | POA: Diagnosis not present

## 2023-11-23 DIAGNOSIS — D2371 Other benign neoplasm of skin of right lower limb, including hip: Secondary | ICD-10-CM | POA: Diagnosis not present

## 2023-11-23 DIAGNOSIS — B351 Tinea unguium: Secondary | ICD-10-CM | POA: Diagnosis not present

## 2023-11-23 NOTE — Progress Notes (Unsigned)
       Subjective:  Patient ID: Monique Benjamin, female    DOB: [birth year],  MRN: [id number]  Monique Benjamin presents to clinic today for:  Chief Complaint  Patient presents with   Diabetes    Patient is here for [facility]   Patient notes nails are thick, discolored, elongated and painful in shoegear when trying to ambulate.  She has painful skin lesion right submet 4  PCP is Pahwani, Kasandra Knudsen, MD.  Past Medical History:  Diagnosis Date   Anxiety    Hemorrhoids    Hyperlipidemia    Hypertension    Osteopenia    Past Surgical History:  Procedure Laterality Date   NO PAST SURGERIES     No Known Allergies  Review of Systems: Negative except as noted in the HPI.  Objective:  Monique Benjamin is a pleasant 77 y.o. female in NAD. AAO x 3.  Vascular Examination: Capillary refill time is 3-5 seconds to toes bilateral. Palpable pedal pulses b/l LE. Digital hair present b/l.  Skin temperature gradient WNL b/l. No varicosities b/l. No cyanosis noted b/l.   Dermatological Examination: Pedal skin with normal turgor, texture and tone b/l. No open wounds. No interdigital macerations b/l. Toenails x10 are 3mm thick, discolored, dystrophic with subungual debris. There is pain with compression of the nail plates.  They are elongated x10.  There is a painful hyperkeratotic lesion on palpation right submet 4.  No surrounding erythema or ecchymosis is noted.  No drainage is noted.  Assessment/Plan: 1. Pain due to onychomycosis of toenails of both feet   2. Benign neoplasm of skin of right foot    The mycotic toenails were sharply debrided x10 with sterile nail nippers and a power debriding burr to decrease bulk/thickness and length.    The benign skin lesion right submet 4 was shaved with sterile #313 blade.  With the patient's consent Cantharone solution was applied to the lesion followed by an occlusive Band-Aid.  She can remove this in approximately 4 to 7 hours.  She may expect blistering  within 24 to 48 hours.  Will recheck on next visit.  Return in about 3 months (around 02/21/2024).   Clerance Lav, DPM, FACFAS [facility]     [address] [place], [place] [address]                Office [phone]  Fax [phone]

## 2024-01-06 ENCOUNTER — Other Ambulatory Visit: Payer: Self-pay | Admitting: Internal Medicine

## 2024-01-06 DIAGNOSIS — Z1231 Encounter for screening mammogram for malignant neoplasm of breast: Secondary | ICD-10-CM

## 2024-02-22 ENCOUNTER — Ambulatory Visit: Admitting: Podiatry

## 2024-02-24 ENCOUNTER — Ambulatory Visit: Payer: Medicare Other

## 2024-02-29 ENCOUNTER — Ambulatory Visit
Admission: RE | Admit: 2024-02-29 | Discharge: 2024-02-29 | Disposition: A | Discharge status: Home / Self Care | Source: Ambulatory Visit | Attending: Internal Medicine

## 2024-02-29 ENCOUNTER — Encounter: Payer: Self-pay | Admitting: Podiatry

## 2024-02-29 ENCOUNTER — Ambulatory Visit (INDEPENDENT_AMBULATORY_CARE_PROVIDER_SITE_OTHER): Payer: Medicare Other | Admitting: Podiatry

## 2024-02-29 VITALS — Ht 65.0 in | Wt 134.8 lb

## 2024-02-29 DIAGNOSIS — M79674 Pain in right toe(s): Secondary | ICD-10-CM | POA: Diagnosis not present

## 2024-02-29 DIAGNOSIS — B351 Tinea unguium: Secondary | ICD-10-CM | POA: Diagnosis not present

## 2024-02-29 DIAGNOSIS — Z1231 Encounter for screening mammogram for malignant neoplasm of breast: Secondary | ICD-10-CM

## 2024-02-29 DIAGNOSIS — D2371 Other benign neoplasm of skin of right lower limb, including hip: Secondary | ICD-10-CM

## 2024-02-29 DIAGNOSIS — M79675 Pain in left toe(s): Secondary | ICD-10-CM

## 2024-02-29 DIAGNOSIS — I739 Peripheral vascular disease, unspecified: Secondary | ICD-10-CM

## 2024-02-29 NOTE — Progress Notes (Unsigned)
    Subjective:  Patient ID: Monique Benjamin, female    DOB: [birth year],  MRN: [id number]  Monique Benjamin presents to clinic today for:  Chief Complaint  Patient presents with   Nail Problem    RFC   Patient notes nails are thick and elongated, causing pain in shoe gear when ambulating.  Patient notes her painful skin lesion right submet 4 is quite uncomfortable today.  She is open to other treatments.  PCP is Ollen Bowl, MD. last seen 01/13/2024  Past Medical History:  Diagnosis Date   Anxiety    Hemorrhoids    Hyperlipidemia    Hypertension    Osteopenia     No Known Allergies  Objective:  LALANI WINKLES is a pleasant 78 y.o. female in NAD. AAO x 3.  Vascular Examination: Patient has palpable DP pulse, absent PT pulse bilateral.  Delayed capillary refill bilateral toes.  Sparse digital hair bilateral.  Proximal to distal cooling WNL bilateral.    Dermatological Examination: Interspaces are clear with no open lesions noted bilateral.  Skin is shiny and atrophic bilateral.  Nails are 3-5mm thick, with yellowish/brown discoloration, subungual debris and distal onycholysis x10.  There is pain with compression of nails x10.  There are hyperkeratotic lesions noted right submet 4 and left plantar hallux IPJ.  Patient qualifies for at-risk foot care because of PVD .  Assessment/Plan: 1. Benign neoplasm of skin of right foot   2. Pain due to onychomycosis of toenails of both feet   3. PVD (peripheral vascular disease) (HCC)     Mycotic nails x10 were sharply debrided with sterile nail nippers and power debriding burr to decrease bulk and length.  Cantharone solution was applied to the right submet 4 lesion after shaving it with a sterile #313 blade.  Bandaid applied and offloading pads.  May expect blistering tomorrow.    Return in about 3 months (around 05/31/2024) for RFC.   Clerance Lav, DPM, FACFAS [facility]     [address] [place], [place] [address]                Office [phone]  Fax [phone]

## 2024-03-15 ENCOUNTER — Ambulatory Visit: Payer: Medicare Other | Admitting: Internal Medicine

## 2024-03-29 NOTE — Progress Notes (Deleted)
 Cardiology Office Note:    Date:  03/29/2024   ID:  Monique Benjamin, DOB 12-12-1946, MRN [id number]  PCP:  Monique Bowl, MD  Cardiologist:  Maisie Fus, MD { Click to update primary MD,subspecialty MD or APP then REFRESH:1}    Referring MD: Monique Bowl, MD   Chief Complaint: follow-up of palpitations   History of Present Illness:    Monique Benjamin is a 78 y.o. female with a history of palpitations with one short run of SVT/AT on monitor in 05/2022, PAD with abnormal ABIs in 01/2023, hypertension,  hyperlipidemia, and anxiety who is followed by Dr. Carolan Clines and presents today for routine follow-up.   Patient was referred to Dr. Carolan Clines in 06/2022 following a ED visit for palpitations. Zio monitor showed one short run of SVT/ AT lasing 5 beats and rare PACs/ PVCs but no significant arrhythmias. Triggered events corresponded with sinus rhythm. She was started on PRN Diltiazem. She was last seen by Dr. Wyline Mood in 02/2023 at which time she was doing well and had only required the Diltiazem once since her last visit.   She presents today for routine follow-up. ***  Palpitations Monitor in 05/2022 showed one short run of SVT/ AT lasing 5 beats and rare PACs/ PVCs but no significant arrhythmias. Triggered events corresponded with sinus rhythm. - Stable. *** - Continue Diltiazem 30mg  every 6 hours PRN for palpitations.   PAD ABIs in 01/2023 were moderately reduced on the right (0.69) and normal on the left (1.3).  - *** - Continue aspirin and statin.   Hypertension BP well controlled. *** - Continue current medications: Amlodipine 5mg  daily and HCTZ 12.5mg  daily. - Managed by PCP.  Hyperlipidemia Lipid panel in *** - Continue Crestor *** daily.  - Managed by PCP.    EKGs/Labs/Other Studies Reviewed:    The following studies were reviewed:  Monitor 06/08/2022 to 06/22/2022: Patient had a min HR of 45 bpm, max HR of 142 bpm, and avg HR of 71 bpm. Predominant  underlying rhythm was Sinus Rhythm. QRS morphology changes were present throughout recording. 1 run of Supraventricular Tachycardia occurred lasting 5 beats with a max rate of 129 bpm (avg 124 bpm). Isolated SVEs were rare (<1.0%), SVE Couplets were rare (<1.0%), and no SVE Triplets were present. Isolated VEs were rare (<1.0%), VE Couplets were rare (<1.0%), and no VE Triplets were present.  Agree with ziopatch. Run of SVT/AT. Triggered events predominantly sinus rhythm. No atrial fibrillation  _______________  ABIs/ TBIs 02/11/2023: Summary: Right: Resting right ankle-brachial index indicates moderate right lower  extremity arterial disease. The right toe-brachial index is abnormal.   Left: Resting left ankle-brachial index is within normal range. The left  toe-brachial index is abnormal.  Although ankle brachial indices are within normal limits (0.95-1.29),  arterial Doppler waveforms at the ankle suggest some component of arterial  occlusive disease.    EKG:  EKG ordered today. EKG personally reviewed and demonstrates ***.  Recent Labs: No results found for requested labs within last 365 days.  Recent Lipid Panel No results found for: "CHOL", "TRIG", "HDL", "CHOLHDL", "VLDL", "LDLCALC", "LDLDIRECT"  Physical Exam:    Vital Signs: There were no vitals taken for this visit.    Wt Readings from Last 3 Encounters:  02/29/24 134 lb 12.8 oz (61.1 kg)  03/16/23 134 lb 12.8 oz (61.1 kg)  06/30/22 135 lb 3.2 oz (61.3 kg)     General: 78 y.o. female in no acute distress.  HEENT: Normocephalic and atraumatic. Sclera clear.  Neck: Supple. No carotid bruits. No JVD. Heart: *** RRR. Distinct S1 and S2. No murmurs, gallops, or rubs.  Lungs: No increased work of breathing. Clear to ausculation bilaterally. No wheezes, rhonchi, or rales.  Abdomen: Soft, non-distended, and non-tender to palpation.  Extremities: No lower extremity edema.  Radial and distal pedal pulses 2+ and equal  bilaterally. Skin: Warm and dry. Neuro: No focal deficits. Psych: Normal affect. Responds appropriately.   Assessment:    No diagnosis found.  Plan:     Disposition: Follow up in ***   Signed, Corrin Parker, PA-C  03/29/2024 11:03 AM    [facility]

## 2024-04-06 ENCOUNTER — Ambulatory Visit: Admitting: Student

## 2024-06-05 NOTE — Progress Notes (Unsigned)
 Cardiology Clinic Note   Patient Name: Monique Benjamin Date of Encounter: 06/06/2024  Primary Care Provider:  Elester Grim, MD Primary Cardiologist:  Bridgette Campus, MD (Inactive)  Patient Profile    Monique Benjamin 78 year old female presents to the clinic today for follow-up evaluation of her essential hypertension, palpitations, and hyperlipidemia.  Past Medical History    Past Medical History:  Diagnosis Date   Anxiety    Hemorrhoids    Hyperlipidemia    Hypertension    Osteopenia    Past Surgical History:  Procedure Laterality Date   NO PAST SURGERIES      Allergies  No Known Allergies  History of Present Illness    Monique Benjamin has a PMH of anxiety, hyperlipidemia, hypertension, osteopenia, and palpitations.  She was seen and evaluated by Dr. Alois Arnt on 03/16/2023.  During that time she reported no palpitations in the prior week.  She did note some palpitations in late June for 3-4 days.  She noted that she would drink minimal amounts of caffeine.  She denied history of arrhythmia, syncope, family history of sudden cardiac death.  She denied lightheadedness or dizziness.  She did note occasional light headaches.  She denied shortness of breath.  She wore a cardiac event monitor which did not show atrial fibrillation.  She was noted to have minimal atrial tachycardia and SVT.  Her triggered events were noted to be mostly sinus rhythm.  She wanted to avoid medications.  Plan was made to continue diltiazem  as needed.  Hydration with electrolytes was recommended.  Follow-up in 12 months was planned.  During visit her blood pressure was well-controlled.  She presents to the clinic today for evaluation and states she continues to have occasional episodes of palpitations.  She is only taken diltiazem  once in the past year.  We reviewed her previous cardiology visit.  We reviewed her prior cardiac event monitor.  She expressed understanding.  I educated about  vagal maneuvers.  She expressed understanding.  Her LDL cholesterol was noted to be 112 on last check 11/24.  She notes that she is intermittently physically active.  Her blood pressure initially today is 144/78 and on recheck is 132/72.  I will refill her diltiazem , have her increase her p.o. hydration, provide vagal maneuver instructions, and plan follow-up in 12 months.  Today she denies chest pain, shortness of breath, lower extremity edema, fatigue, melena, hematuria, hemoptysis, diaphoresis, weakness, presyncope, syncope, orthopnea, and PND.   Home Medications    Prior to Admission medications   Medication Sig Start Date End Date Taking? Authorizing Provider  amLODipine (NORVASC) 5 MG tablet Take 5 mg by mouth daily. 09/30/18   [provider]  aspirin 81 MG EC tablet 1 tablet    [provider]  Calcium Carbonate-Vitamin D (CALCIUM-VITAMIN D) 500-200 MG-UNIT per tablet Take 1 tablet by mouth 3 (three) times daily.    [provider]  diltiazem  (CARDIZEM ) 30 MG tablet Take 1 tablet (30 mg total) by mouth every 6 (six) hours as needed (for palpitations). 06/30/22   Bridgette Campus, MD  hydrochlorothiazide (HYDRODIURIL) 12.5 MG tablet Take 12.5 mg by mouth every morning. 09/09/18   [provider]  meloxicam  (MOBIC ) 7.5 MG tablet Take 1 tablet (7.5 mg total) by mouth daily. 06/14/23   McCaughan, Dia D, DPM  omeprazole (PRILOSEC OTC) 20 MG tablet 1 tablet 09/15/18   [provider]  rosuvastatin (CRESTOR) 10 MG tablet Take 10 mg  by mouth daily. 04/24/20   [provider]  rosuvastatin (CRESTOR) 5 MG tablet Take 5 mg by mouth daily. 07/27/18   [provider]  timolol (TIMOPTIC) 0.5 % ophthalmic solution PLACE 1 DROP INTO BOTH EYES IN THE MORNING. 08/25/22   [provider]    Family History    Family History  Problem Relation Age of Onset   Heart disease Mother    Diabetes Mother    Heart failure Father    Hepatitis C Sister     Breast cancer Sister 59   Breast cancer Sister    She indicated that her mother is alive. She indicated that her father is deceased.  Social History    Social History   Socioeconomic History   Marital status: Single    Spouse name: Not on file   Number of children: 0   Years of education: college   Highest education level: Not on file  Occupational History   Occupation: Retired    Associate Professor: RETIRED  Tobacco Use   Smoking status: Never   Smokeless tobacco: Never  Substance and Sexual Activity   Alcohol use: No   Drug use: No   Sexual activity: Not on file  Other Topics Concern   Not on file  Social History Narrative   Patient lives at home alone.   Caffeine Use: none   Social Drivers of Corporate investment banker Strain: Not on file  Food Insecurity: Not on file  Transportation Needs: Not on file  Physical Activity: Not on file  Stress: Not on file  Social Connections: Not on file  Intimate Partner Violence: Not on file     Review of Systems    General:  No chills, fever, night sweats or weight changes.  Cardiovascular:  No chest pain, dyspnea on exertion, edema, orthopnea, palpitations, paroxysmal nocturnal dyspnea. Dermatological: No rash, lesions/masses Respiratory: No cough, dyspnea Urologic: No hematuria, dysuria Abdominal:   No nausea, vomiting, diarrhea, bright red blood per rectum, melena, or hematemesis Neurologic:  No visual changes, wkns, changes in mental status. All other systems reviewed and are otherwise negative except as noted above.  Physical Exam    VS:  BP 132/72   Pulse 73   Ht 5' 5 (1.651 m)   Wt 128 lb (58.1 kg)   SpO2 100%   BMI 21.30 kg/m  , BMI Body mass index is 21.3 kg/m. GEN: Well nourished, well developed, in no acute distress. HEENT: normal. Neck: Supple, no JVD, carotid bruits, or masses. Cardiac: RRR, no murmurs, rubs, or gallops. No clubbing, cyanosis, edema.  Radials/DP/PT 2+ and equal bilaterally.  Respiratory:   Respirations regular and unlabored, clear to auscultation bilaterally. GI: Soft, nontender, nondistended, BS + x 4. MS: no deformity or atrophy. Skin: warm and dry, no rash. Neuro:  Strength and sensation are intact. Psych: Normal affect.  Accessory Clinical Findings    Recent Labs: No results found for requested labs within last 365 days.   Recent Lipid Panel No results found for: CHOL, TRIG, HDL, CHOLHDL, VLDL, LDLCALC, LDLDIRECT       ECG personally reviewed by me today- EKG Interpretation Date/Time:  Tuesday June 06 2024 13:48:19 EDT Ventricular Rate:  73 PR Interval:  134 QRS Duration:  86 QT Interval:  388 QTC Calculation: 427 R Axis:   -17  Text Interpretation: Normal sinus rhythm Normal ECG When compared with ECG of 08-Jun-2022 03:19, No significant change was found Confirmed by Lawana Pray [id number]) on 06/06/2024 1:50:26  PM    Cardiac event monitor 06/11/2022  Triggered events were for sinus, sinus tachycardia. Brief run of AT. 1x SVT event at 2am. No significant arrhythmia.       Assessment & Plan   1.  Palpitations-denies recent episodes of lightheadedness, presyncope or syncope.  Does continue to note occasional brief episodes of palpitations.  Has not needed to take any as needed doses of diltiazem  recently.  Notes that over the last year she has taken it only 1 time. Avoid triggers caffeine, chocolate, EtOH, dehydration etc. Maintain physical activity Continue diltiazem  as needed  Essential hypertension-BP today 132/72. Maintain blood pressure log Heart healthy low-sodium diet Continue amlodipine, hydrochlorothiazide  Hyperlipidemia-LDL 112 on  11/16/23. High-fiber diet Maintain physical activity Continue aspirin, rosuvastatin Follows with PCP  Disposition: Follow-up with Dr. Chancy Comber or me in 9-12 months.   Chet Cota. Gehrig Patras NP-C     06/06/2024, 2:13 PM [facility] [address] Office  [phone] Fax [phone]    I spent 14 minutes examining this patient, reviewing medications, and using patient centered shared decision making involving their cardiac care.   I spent  20 minutes reviewing past medical history,  medications, and prior cardiac tests.

## 2024-06-06 ENCOUNTER — Ambulatory Visit: Admitting: Podiatry

## 2024-06-06 ENCOUNTER — Ambulatory Visit: Attending: General Practice | Admitting: General Practice

## 2024-06-06 ENCOUNTER — Encounter: Payer: Self-pay | Admitting: General Practice

## 2024-06-06 VITALS — BP 132/72 | HR 73 | Ht 65.0 in | Wt 128.0 lb

## 2024-06-06 DIAGNOSIS — E782 Mixed hyperlipidemia: Secondary | ICD-10-CM | POA: Diagnosis not present

## 2024-06-06 DIAGNOSIS — I739 Peripheral vascular disease, unspecified: Secondary | ICD-10-CM

## 2024-06-06 DIAGNOSIS — R002 Palpitations: Secondary | ICD-10-CM | POA: Diagnosis not present

## 2024-06-06 DIAGNOSIS — I10 Essential (primary) hypertension: Secondary | ICD-10-CM

## 2024-06-06 MED ORDER — DILTIAZEM HCL 30 MG PO TABS: 30.0000 mg | ORAL_TABLET | Freq: Four times a day (QID) | ORAL | 5 refills | Status: AC | PRN

## 2024-06-06 NOTE — Patient Instructions (Addendum)
 Medication Instructions:  Refill for Diltiazem  (Cardizem ) sent to your pharmacy.  *If you need a refill on your cardiac medications before your next appointment, please call your pharmacy*  Lab Work: None ordered today. If you have labs (blood work) drawn today and your tests are completely normal, you will receive your results only by: MyChart Message (if you have MyChart) OR A paper copy in the mail If you have any lab test that is abnormal or we need to change your treatment, we will call you to review the results.  Testing/Procedures: None ordered today.  Follow-Up: At [facility], you and your health needs are our priority.  As part of our continuing mission to provide you with exceptional heart care, our providers are all part of one team.  This team includes your primary Cardiologist (physician) and Advanced Practice Providers or APPs (Physician Assistants and Nurse Practitioners) who all work together to provide you with the care you need, when you need it.  Your next appointment:   1 year(s)  Provider:   Lawana Pray, NP   We recommend signing up for the patient portal called MyChart.  Sign up information is provided on this After Visit Summary.  MyChart is used to connect with patients for Virtual Visits (Telemedicine).  Patients are able to view lab/test results, encounter notes, upcoming appointments, etc.  Non-urgent messages can be sent to your provider as well.   To learn more about what you can do with MyChart, go to [email or web address].   Other Instructions IF YOU HAVE AN EPISODE WHEN YOUR HEART IS BEATING FAST - SIT DOWN AND PUT YOUR FEET UP  Recommendations for vagal maneuvers that you can try to slow your fast heart rate include: Bearing down -- Bearing down means that you try to breathe out with your stomach muscles but you don't let air out of your nose or mouth. Coughing or gagging Icy, cold towel on face or drink ice cold water    Vagal  maneuvers that you can try to slow your fast heart rate include:      Bearing down. Bearing down means that you try to breathe out with your stomach muscles but you don't let air out of your nose or mouth.            This can also be accomplished by blowing hard into a pen or other tube with a cap on it for 10-15 seconds.       Putting an ice-cold, wet towel on your face.      Coughing or gagging. ------------------------------------------------------------------------ Increase physical activity as tolerated.  Increase oral hydration.

## 2024-07-10 ENCOUNTER — Ambulatory Visit (INDEPENDENT_AMBULATORY_CARE_PROVIDER_SITE_OTHER): Admitting: Podiatry

## 2024-07-10 DIAGNOSIS — I739 Peripheral vascular disease, unspecified: Secondary | ICD-10-CM

## 2024-07-10 DIAGNOSIS — M79675 Pain in left toe(s): Secondary | ICD-10-CM

## 2024-07-10 DIAGNOSIS — M79674 Pain in right toe(s): Secondary | ICD-10-CM

## 2024-07-10 DIAGNOSIS — L84 Corns and callosities: Secondary | ICD-10-CM

## 2024-07-10 DIAGNOSIS — B351 Tinea unguium: Secondary | ICD-10-CM | POA: Diagnosis not present

## 2024-07-10 NOTE — Progress Notes (Signed)
    Subjective:  Patient ID: Monique Benjamin, female    DOB: Mar 14, 1946,  MRN: [id number]  Monique Benjamin presents to clinic today for:  Chief Complaint  Patient presents with   RFC    Takes ASA 81 mg. Not diabetic. Has callus on ball of right foot near 5th toe.    Patient notes nails are thick and elongated, causing pain in shoe gear when ambulating.  She has painful calluses right submet 3 and right submet 5.  She states that the right submet 4 callus spontaneously resolved on its own and now she has a new spot right submet 5.  Last seen around 05/18/2024  PCP is Pahwani, Velna SAUNDERS, MD.  Last seen around 05/18/2024  Past Medical History:  Diagnosis Date   Anxiety    Hemorrhoids    Hyperlipidemia    Hypertension    Osteopenia    No Known Allergies  Objective:  KAYTLYNNE NEACE is a pleasant 78 y.o. female in NAD. AAO x 3.  Vascular Examination: Patient has palpable DP pulse, absent PT pulse bilateral.  Delayed capillary refill bilateral toes.  Sparse digital hair bilateral.  Proximal to distal cooling WNL bilateral.    Dermatological Examination: Interspaces are clear with no open lesions noted bilateral.  Skin is shiny and atrophic bilateral.  Nails are 3-97mm thick, with yellowish/brown discoloration, subungual debris and distal onycholysis x10.  There is pain with compression of nails x10.  There are hyperkeratotic lesions noted right submet 3 and right submet 5.  Patient qualifies for at-risk foot care because of PVD.  Assessment/Plan: 1. Pain due to onychomycosis of toenails of both feet   2. Callus   3. PVD (peripheral vascular disease) (HCC)    Mycotic nails x10 were sharply debrided with sterile nail nippers and power debriding burr to decrease bulk and length.  Hyperkeratotic lesions right submet 3 and right submet 5 were shaved with #312 blade.  Return in about 3 months (around 10/10/2024) for RFC.   Awanda CHARM Imperial, DPM, FACFAS [facility]     [address] [place], [place] [address]                Office [phone]  Fax [phone]

## 2024-10-10 ENCOUNTER — Ambulatory Visit: Admitting: Podiatry

## 2024-10-17 ENCOUNTER — Ambulatory Visit: Admitting: Podiatry

## 2024-10-17 DIAGNOSIS — M79675 Pain in left toe(s): Secondary | ICD-10-CM | POA: Diagnosis not present

## 2024-10-17 DIAGNOSIS — B351 Tinea unguium: Secondary | ICD-10-CM | POA: Diagnosis not present

## 2024-10-17 DIAGNOSIS — M79674 Pain in right toe(s): Secondary | ICD-10-CM

## 2024-10-17 DIAGNOSIS — I739 Peripheral vascular disease, unspecified: Secondary | ICD-10-CM

## 2024-10-17 DIAGNOSIS — L84 Corns and callosities: Secondary | ICD-10-CM | POA: Diagnosis not present

## 2024-10-17 NOTE — Progress Notes (Unsigned)
       Subjective:  Patient ID: Monique Benjamin, female    DOB: May 10, 1946,  MRN: [id number]  Monique Benjamin presents to clinic today for:  Chief Complaint  Patient presents with   [facility]    RFC with callous Not Diabetic, ASA   Patient notes nails are thick and elongated, causing pain in shoe gear when ambulating.  She has painful calluses right submet 3 and right submet 5.    PCP is Vernon Velna SAUNDERS, MD.  Last seen around 08/09/2024  Past Medical History:  Diagnosis Date   Anxiety    Hemorrhoids    Hyperlipidemia    Hypertension    Osteopenia    No Known Allergies  Objective:  [name] is a pleasant 78 y.o. female in NAD. AAO x 3.  Vascular Examination: Patient has palpable DP pulse, absent PT pulse bilateral.  Delayed capillary refill bilateral toes.  Sparse digital hair bilateral.  Proximal to distal cooling WNL bilateral.    Dermatological Examination: Interspaces are clear with no open lesions noted bilateral.  Skin is shiny and atrophic bilateral.  Nails are 3-70mm thick, with yellowish/brown discoloration, subungual debris and distal onycholysis x10.  There is pain with compression of nails x10.  There are hyperkeratotic lesions noted right submet 3 and right submet 5.  Patient qualifies for at-risk foot care because of PVD.  Assessment/Plan: 1. Pain due to onychomycosis of toenails of both feet   2. Callus   3. PVD (peripheral vascular disease)    Mycotic nails x10 were sharply debrided with sterile nail nippers and power debriding burr to decrease bulk and length.  Hyperkeratotic lesions right submet 3 and right submet 5 were shaved with #312 blade.  Return in about 3 months (around 01/17/2025) for RFC.   Awanda CHARM Imperial, DPM, FACFAS [facility]     [address] [place], [place] [address]                Office [phone]  Fax [phone]

## 2025-01-31 ENCOUNTER — Ambulatory Visit: Admitting: Podiatry
